# Patient Record
Sex: Male | Born: 1945 | Race: White | Hispanic: No | Marital: Single | Smoking: Former smoker
Health system: Southern US, Community
[De-identification: ages and names within clinical notes are randomized; demographics above are authoritative.]

## PROBLEM LIST (undated history)

## (undated) DIAGNOSIS — E119 Type 2 diabetes mellitus without complications: Secondary | ICD-10-CM

## (undated) DIAGNOSIS — IMO0001 Reserved for inherently not codable concepts without codable children: Secondary | ICD-10-CM

## (undated) DIAGNOSIS — I10 Essential (primary) hypertension: Secondary | ICD-10-CM

## (undated) DIAGNOSIS — I5040 Unspecified combined systolic (congestive) and diastolic (congestive) heart failure: Secondary | ICD-10-CM

## (undated) DIAGNOSIS — I272 Pulmonary hypertension, unspecified: Secondary | ICD-10-CM

## (undated) DIAGNOSIS — J9610 Chronic respiratory failure, unspecified whether with hypoxia or hypercapnia: Secondary | ICD-10-CM

## (undated) DIAGNOSIS — I2510 Atherosclerotic heart disease of native coronary artery without angina pectoris: Secondary | ICD-10-CM

## (undated) DIAGNOSIS — J449 Chronic obstructive pulmonary disease, unspecified: Secondary | ICD-10-CM

## (undated) DIAGNOSIS — Z794 Long term (current) use of insulin: Secondary | ICD-10-CM

---

## 2015-10-17 ENCOUNTER — Inpatient Hospital Stay
Admission: RE | Admit: 2015-10-17 | Discharge: 2015-11-25 | Disposition: A | Discharge status: Skilled Nursing Facility | Payer: Medicare Other | Attending: Internal Medicine | Admitting: Internal Medicine

## 2015-10-17 DIAGNOSIS — J9621 Acute and chronic respiratory failure with hypoxia: Secondary | ICD-10-CM

## 2015-10-17 DIAGNOSIS — J810 Acute pulmonary edema: Secondary | ICD-10-CM

## 2015-10-17 DIAGNOSIS — Z431 Encounter for attention to gastrostomy: Secondary | ICD-10-CM

## 2015-10-17 DIAGNOSIS — R059 Cough, unspecified: Secondary | ICD-10-CM

## 2015-10-17 DIAGNOSIS — R0603 Acute respiratory distress: Secondary | ICD-10-CM

## 2015-10-17 DIAGNOSIS — J9690 Respiratory failure, unspecified, unspecified whether with hypoxia or hypercapnia: Secondary | ICD-10-CM

## 2015-10-17 DIAGNOSIS — I272 Pulmonary hypertension, unspecified: Secondary | ICD-10-CM

## 2015-10-17 DIAGNOSIS — I4901 Ventricular fibrillation: Secondary | ICD-10-CM

## 2015-10-17 DIAGNOSIS — J430 Unilateral pulmonary emphysema [MacLeod's syndrome]: Secondary | ICD-10-CM

## 2015-10-17 DIAGNOSIS — Z931 Gastrostomy status: Secondary | ICD-10-CM

## 2015-10-17 DIAGNOSIS — Z430 Encounter for attention to tracheostomy: Secondary | ICD-10-CM

## 2015-10-17 DIAGNOSIS — R05 Cough: Secondary | ICD-10-CM

## 2015-10-17 DIAGNOSIS — I252 Old myocardial infarction: Secondary | ICD-10-CM

## 2015-10-17 DIAGNOSIS — Z452 Encounter for adjustment and management of vascular access device: Secondary | ICD-10-CM

## 2015-10-17 DIAGNOSIS — I340 Nonrheumatic mitral (valve) insufficiency: Secondary | ICD-10-CM

## 2015-10-17 DIAGNOSIS — I2510 Atherosclerotic heart disease of native coronary artery without angina pectoris: Secondary | ICD-10-CM

## 2015-10-17 DIAGNOSIS — J9622 Acute and chronic respiratory failure with hypercapnia: Secondary | ICD-10-CM

## 2015-10-17 DIAGNOSIS — J9611 Chronic respiratory failure with hypoxia: Secondary | ICD-10-CM

## 2015-10-17 DIAGNOSIS — Z4659 Encounter for fitting and adjustment of other gastrointestinal appliance and device: Secondary | ICD-10-CM

## 2015-10-17 HISTORY — DX: Atherosclerotic heart disease of native coronary artery without angina pectoris: I25.10

## 2015-10-17 HISTORY — DX: Pulmonary hypertension, unspecified: I27.20

## 2015-10-17 HISTORY — DX: Long term (current) use of insulin: Z79.4

## 2015-10-17 HISTORY — DX: Type 2 diabetes mellitus without complications: E11.9

## 2015-10-17 HISTORY — DX: Chronic obstructive pulmonary disease, unspecified: J44.9

## 2015-10-17 HISTORY — DX: Essential (primary) hypertension: I10

## 2015-10-17 HISTORY — DX: Unspecified combined systolic (congestive) and diastolic (congestive) heart failure: I50.40

## 2015-10-17 HISTORY — DX: Chronic respiratory failure, unspecified whether with hypoxia or hypercapnia: J96.10

## 2015-10-17 HISTORY — DX: Reserved for inherently not codable concepts without codable children: IMO0001

## 2015-10-17 LAB — PROTIME-INR
INR: 1.14
PROTHROMBIN TIME: 14.6 s (ref 11.4–15.2)

## 2015-10-19 ENCOUNTER — Other Ambulatory Visit (HOSPITAL_COMMUNITY): Payer: Medicare Other

## 2015-10-19 ENCOUNTER — Other Ambulatory Visit: Payer: Self-pay

## 2015-10-19 LAB — COMPREHENSIVE METABOLIC PANEL
ALT: 33 U/L (ref 17–63)
AST: 20 U/L (ref 15–41)
Albumin: 2.9 g/dL — ABNORMAL LOW (ref 3.5–5.0)
Alkaline Phosphatase: 93 U/L (ref 38–126)
Anion gap: 9 (ref 5–15)
BILIRUBIN TOTAL: 0.7 mg/dL (ref 0.3–1.2)
BUN: 26 mg/dL — ABNORMAL HIGH (ref 6–20)
CHLORIDE: 93 mmol/L — AB (ref 101–111)
CO2: 34 mmol/L — ABNORMAL HIGH (ref 22–32)
CREATININE: 1.02 mg/dL (ref 0.61–1.24)
Calcium: 9 mg/dL (ref 8.9–10.3)
GFR, EST NON AFRICAN AMERICAN: 54 mL/min — AB (ref >60)
Glucose, Bld: 301 mg/dL — ABNORMAL HIGH (ref 65–99)
Potassium: 4.8 mmol/L (ref 3.5–5.1)
Sodium: 136 mmol/L (ref 135–145)
TOTAL PROTEIN: 5.6 g/dL — AB (ref 6.5–8.1)

## 2015-10-19 LAB — TROPONIN I
TROPONIN I: 0.04 ng/mL — AB (ref <0.03)
TROPONIN I: 0.04 ng/mL — AB (ref <0.03)
Troponin I: 0.05 ng/mL (ref <0.03)

## 2015-10-19 LAB — BLOOD GAS, ARTERIAL
ACID-BASE EXCESS: 10.5 mmol/L — AB (ref 0.0–2.0)
Acid-Base Excess: 10.9 mmol/L — ABNORMAL HIGH (ref 0.0–2.0)
BICARBONATE: 37.5 mmol/L — AB (ref 20.0–28.0)
Bicarbonate: 36.8 mmol/L — ABNORMAL HIGH (ref 20.0–28.0)
Delivery systems: POSITIVE
Expiratory PAP: 6
Expiratory PAP: 7
FIO2: 1
FIO2: 50
INSPIRATORY PAP: 16
Inspiratory PAP: 12
MODE: POSITIVE
O2 SAT: 99.7 %
O2 SAT: 99.1 %
PCO2 ART: 69.8 mmHg — AB (ref 32.0–48.0)
PH ART: 7.268 — AB (ref 7.350–7.450)
PO2 ART: 233 mmHg — AB (ref 83.0–108.0)
Patient temperature: 98.6
Patient temperature: 98.6
pCO2 arterial: 84.8 mmHg (ref 32.0–48.0)
pH, Arterial: 7.342 — ABNORMAL LOW (ref 7.350–7.450)
pO2, Arterial: 132 mmHg — ABNORMAL HIGH (ref 83.0–108.0)

## 2015-10-19 LAB — CBC WITH DIFFERENTIAL/PLATELET
BASOS ABS: 0 10*3/uL (ref 0.0–0.1)
BASOS PCT: 0 %
EOS ABS: 0.1 10*3/uL (ref 0.0–0.7)
Eosinophils Relative: 1 %
HCT: 35 % — ABNORMAL LOW (ref 39.0–52.0)
HEMOGLOBIN: 10.9 g/dL — AB (ref 13.0–17.0)
LYMPHS ABS: 0.7 10*3/uL (ref 0.7–4.0)
Lymphocytes Relative: 6 %
MCH: 29 pg (ref 26.0–34.0)
MCHC: 31.1 g/dL (ref 30.0–36.0)
MCV: 93.1 fL (ref 78.0–100.0)
Monocytes Absolute: 0.6 10*3/uL (ref 0.1–1.0)
Monocytes Relative: 5 %
NEUTROS PCT: 88 %
Neutro Abs: 11 10*3/uL — ABNORMAL HIGH (ref 1.7–7.7)
PLATELETS: 301 10*3/uL (ref 150–400)
RBC: 3.76 MIL/uL — AB (ref 4.22–5.81)
RDW: 15.5 % (ref 11.5–15.5)
WBC: 12.4 10*3/uL — AB (ref 4.0–10.5)

## 2015-10-19 LAB — CBC
HCT: 33.5 % — ABNORMAL LOW (ref 39.0–52.0)
HEMOGLOBIN: 10.2 g/dL — AB (ref 13.0–17.0)
MCH: 28.5 pg (ref 26.0–34.0)
MCHC: 30.4 g/dL (ref 30.0–36.0)
MCV: 93.6 fL (ref 78.0–100.0)
PLATELETS: 269 10*3/uL (ref 150–400)
RBC: 3.58 MIL/uL — ABNORMAL LOW (ref 4.22–5.81)
RDW: 15.3 % (ref 11.5–15.5)
WBC: 9.5 10*3/uL (ref 4.0–10.5)

## 2015-10-19 LAB — BASIC METABOLIC PANEL
Anion gap: 6 (ref 5–15)
BUN: 27 mg/dL — AB (ref 6–20)
CHLORIDE: 92 mmol/L — AB (ref 101–111)
CO2: 40 mmol/L — ABNORMAL HIGH (ref 22–32)
CREATININE: 1.32 mg/dL — AB (ref 0.61–1.24)
Calcium: 8.8 mg/dL — ABNORMAL LOW (ref 8.9–10.3)
GFR calc Af Amer: >60 mL/min (ref >60)
GFR calc non Af Amer: 52 mL/min — ABNORMAL LOW (ref >60)
Glucose, Bld: 323 mg/dL — ABNORMAL HIGH (ref 65–99)
Potassium: 4.3 mmol/L (ref 3.5–5.1)
SODIUM: 138 mmol/L (ref 135–145)

## 2015-10-19 LAB — PHOSPHORUS: Phosphorus: 4.9 mg/dL — ABNORMAL HIGH (ref 2.5–4.6)

## 2015-10-19 LAB — MAGNESIUM: MAGNESIUM: 1.8 mg/dL (ref 1.7–2.4)

## 2015-10-19 LAB — PROTIME-INR
INR: 1.14
Prothrombin Time: 14.6 seconds (ref 11.4–15.2)

## 2015-10-20 ENCOUNTER — Encounter: Payer: Self-pay | Admitting: Adult Health

## 2015-10-20 DIAGNOSIS — R0603 Acute respiratory distress: Secondary | ICD-10-CM

## 2015-10-20 DIAGNOSIS — R0600 Dyspnea, unspecified: Secondary | ICD-10-CM

## 2015-10-20 LAB — TSH: TSH: 0.961 u[IU]/mL (ref 0.350–4.500)

## 2015-10-20 NOTE — Consult Note (Signed)
Name: Preston Benjamin MRN: [id number] DOB: [birth year]    ADMISSION DATE:  10/17/2015 CONSULTATION DATE:  9/18  REFERRING MD :  Hijazi ([facility])   CHIEF COMPLAINT:  Respiratory failure   BRIEF PATIENT DESCRIPTION: 70yo male with hx combined heart failure, severe COPD, HTN, DM with multiple recent admissions to [facility] for acute on chronic respiratory failure r/t AECOPD and decompensated heart failure.  Many of these admissions have required intubation.  On previous d/c 9/1 he was sent home with trilogy bipap.  He unfortunately continued to have increased somnolence and SOB and was admitted again 9/5, again requiring intubation.  During that admission he was found to have worsening pulmonary HTN with PASP as well as significant bilateral pleural effusions s/p thoracentesis (transudate).  He was diuresed and treated for AECOPD and again extubated but continued to required high flow O2 and was d/c to [facility] 9/15.  PCCM consulted to assist as he continues to require vapotherm 30LPM.    SIGNIFICANT EVENTS    STUDIES:     HISTORY OF PRESENT ILLNESS:  70yo male with hx combined heart failure, severe COPD, HTN, DM with multiple recent admissions to [facility] for acute on chronic respiratory failure r/t AECOPD and decompensated heart failure.  Many of these admissions have required intubation.  On previous d/c 9/1 he was sent home with trilogy bipap.  He unfortunately continued to have increased somnolence and SOB and was admitted again 9/5, again requiring intubation.  During that admission he was found to have worsening pulmonary HTN with PASP as well as significant bilateral pleural effusions s/p thoracentesis (transudate).  He was diuresed and treated for AECOPD and again extubated but continued to required high flow O2 and was d/c to [facility] 9/15.  PCCM consulted to assist as he continues to require vapotherm 30LPM.  Currently denies SOB, chest pain, edema.  Wants to get up.     PAST  MEDICAL HISTORY :   has a past medical history of CAD (coronary artery disease); Chronic respiratory failure (HCC); Combined congestive systolic and diastolic heart failure (HCC); COPD (chronic obstructive pulmonary disease) (HCC); Hypertension; IDDM (insulin dependent diabetes mellitus) (HCC); and Pulmonary hypertension (HCC).  has no past surgical history on file. Prior to Admission medications   Not on File   Allergies no known allergies  FAMILY HISTORY:  CAD, COPD   SOCIAL HISTORY:  reports that he has quit smoking. He has never used smokeless tobacco.  REVIEW OF SYSTEMS:   As per HPI - All other systems reviewed and were neg.    SUBJECTIVE:   VITAL SIGNS: HR 82 RR 18 sats 96% on 30LPM vapotherm  PHYSICAL EXAMINATION: General:  Chronically ill appearing male, NAD in bed  Neuro:  Awake, alert, intermittent confusion but overall appropriate, MAE, gen weakness  HEENT:  Mm moist, no JVD  Cardiovascular:  s1s2 rrr Lungs:  resps even non labored on high flow nasal cannula, diminished bases, few scattered wheezes  Abdomen:  Round, soft, +bs  Musculoskeletal:  Warm and dry, scant BLE edema    Recent Labs Lab 10/17/15 1647 10/19/15 1007  NA 136 138  K 4.8 4.3  CL 93* 92*  CO2 34* 40*  BUN 26* 27*  CREATININE 1.02 1.32*  GLUCOSE 301* 323*    Recent Labs Lab 10/17/15 1647 10/19/15 1007  HGB 10.9* 10.2*  HCT 35.0* 33.5*  WBC 12.4* 9.5  PLT 301 269   Dg Chest Port 1 View  Result Date: 10/19/2015 CLINICAL  DATA:  Respiratory failure. Pt unable to relate history at this time. EXAM: PORTABLE CHEST 1 VIEW COMPARISON:  None. FINDINGS: Normal cardiac silhouette. Band of atelectasis in the LEFT lower lobe. Diffuse airspace disease in the RIGHT upper lobe and RIGHT lower lobe. No pneumothorax. IMPRESSION: 1. Diffuse airspace disease in the RIGHT upper lobe and RIGHT lower lobe suggests asymmetric edema versus multifocal pneumonia. 2. LEFT lower lobe atelectasis. Electronically  Signed   By: Genevive BiStewart  Edmunds M.D.   On: 10/19/2015 09:52    ASSESSMENT / PLAN:  Acute on chronic hypercarbic and hypoxic respiratory failure - multifactorial in setting decompensated combined heart failure, pulmonary HTN, severe COPD likely c/b oversedation at home.  Requiring high flow O2.  Has had multiple recent admissions requiring intubation.  Readmitted despite d/c on trilogy.   PLAN -  Aggressive diuresis as SCr and BP allow  BD's  Mobilize  Wean FiO2 as able - has weaned from 40LPM/40% to 30LMP/35%  Avoid sedating medications F/u echo   F/u CXR  If requires intubation again would need trach     Dirk DressKaty Whiteheart, NP 10/20/2015  10:51 AM Pager: [phone] or [phone]   STAFF NOTE: I, Rory Percyaniel Feinstein, MD FACP have personally reviewed patient's available data, including medical history, events of note, physical examination and test results as part of my evaluation. I have discussed with resident/NP and other care providers such as pharmacist, RN and RRT. In addition, I personally evaluated patient and elicited key findings of: no distress in chair eating, coarse distant BS, throughout day with improved O2 needs now about to get to traditional nasal cannula O2, his pulm htn also playing a part , needs contuinued diuresis, need re chek crt  Prior to lasix increase however , no new chem noted, he has fluid in fissure on pcxr, if he is stagnent after above would echo r/o shunt with bubbles and Ct chest  Mcarthur Rossettianiel J. Tyson AliasFeinstein, MD, FACP Pgr: [phone] [facility] 10/20/2015 12:34 PM

## 2015-10-21 LAB — TROPONIN I: TROPONIN I: 0.04 ng/mL — AB (ref <0.03)

## 2015-10-22 ENCOUNTER — Other Ambulatory Visit (HOSPITAL_COMMUNITY): Payer: Medicare Other

## 2015-10-22 DIAGNOSIS — I2510 Atherosclerotic heart disease of native coronary artery without angina pectoris: Secondary | ICD-10-CM

## 2015-10-22 DIAGNOSIS — R0600 Dyspnea, unspecified: Secondary | ICD-10-CM | POA: Diagnosis not present

## 2015-10-22 DIAGNOSIS — I252 Old myocardial infarction: Secondary | ICD-10-CM | POA: Diagnosis not present

## 2015-10-22 DIAGNOSIS — I4901 Ventricular fibrillation: Secondary | ICD-10-CM | POA: Diagnosis not present

## 2015-10-22 DIAGNOSIS — I272 Other secondary pulmonary hypertension: Secondary | ICD-10-CM

## 2015-10-22 DIAGNOSIS — I340 Nonrheumatic mitral (valve) insufficiency: Secondary | ICD-10-CM

## 2015-10-22 LAB — HEPATIC FUNCTION PANEL
ALBUMIN: 3 g/dL — AB (ref 3.5–5.0)
ALT: 55 U/L (ref 17–63)
AST: 35 U/L (ref 15–41)
Alkaline Phosphatase: 86 U/L (ref 38–126)
BILIRUBIN TOTAL: 0.9 mg/dL (ref 0.3–1.2)
Bilirubin, Direct: 0.2 mg/dL (ref 0.1–0.5)
Indirect Bilirubin: 0.7 mg/dL (ref 0.3–0.9)
Total Protein: 5.3 g/dL — ABNORMAL LOW (ref 6.5–8.1)

## 2015-10-22 LAB — CBC
HCT: 32.7 % — ABNORMAL LOW (ref 39.0–52.0)
Hemoglobin: 10.2 g/dL — ABNORMAL LOW (ref 13.0–17.0)
MCH: 28.9 pg (ref 26.0–34.0)
MCHC: 31.2 g/dL (ref 30.0–36.0)
MCV: 92.6 fL (ref 78.0–100.0)
Platelets: 296 10*3/uL (ref 150–400)
RBC: 3.53 MIL/uL — ABNORMAL LOW (ref 4.22–5.81)
RDW: 15.1 % (ref 11.5–15.5)
WBC: 13.3 10*3/uL — ABNORMAL HIGH (ref 4.0–10.5)

## 2015-10-22 LAB — BLOOD GAS, ARTERIAL
ACID-BASE EXCESS: 10 mmol/L — AB (ref 0.0–2.0)
Acid-Base Excess: 7.4 mmol/L — ABNORMAL HIGH (ref 0.0–2.0)
Acid-Base Excess: 12.1 mmol/L — ABNORMAL HIGH (ref 0.0–2.0)
BICARBONATE: 35.6 mmol/L — AB (ref 20.0–28.0)
Bicarbonate: 33.1 mmol/L — ABNORMAL HIGH (ref 20.0–28.0)
Bicarbonate: 36.9 mmol/L — ABNORMAL HIGH (ref 20.0–28.0)
Delivery systems: POSITIVE
Expiratory PAP: 8
FIO2: 1
FIO2: 35
FIO2: 80
Inspiratory PAP: 16
MECHVT: 500 mL
O2 Saturation: 100 %
O2 Saturation: 97.9 %
PATIENT TEMPERATURE: 98.6
PEEP: 5 cmH2O
PO2 ART: 297 mmHg — AB (ref 83.0–108.0)
Patient temperature: 98.6
Patient temperature: 98.6
RATE: 15 resp/min
pCO2 arterial: 64 mmHg — ABNORMAL HIGH (ref 32.0–48.0)
pCO2 arterial: 55.6 mmHg — ABNORMAL HIGH (ref 32.0–48.0)
pCO2 arterial: 64.4 mmHg — ABNORMAL HIGH (ref 32.0–48.0)
pH, Arterial: 7.334 — ABNORMAL LOW (ref 7.350–7.450)
pH, Arterial: 7.438 (ref 7.350–7.450)
pH, Arterial: 7.362 (ref 7.350–7.450)
pO2, Arterial: 391 mmHg — ABNORMAL HIGH (ref 83.0–108.0)
pO2, Arterial: 98 mmHg (ref 83.0–108.0)

## 2015-10-22 LAB — BASIC METABOLIC PANEL
Anion gap: 11 (ref 5–15)
Anion gap: 8 (ref 5–15)
BUN: 25 mg/dL — ABNORMAL HIGH (ref 6–20)
BUN: 23 mg/dL — AB (ref 6–20)
CALCIUM: 8.7 mg/dL — AB (ref 8.9–10.3)
CO2: 32 mmol/L (ref 22–32)
CO2: 36 mmol/L — ABNORMAL HIGH (ref 22–32)
CREATININE: 1.08 mg/dL (ref 0.61–1.24)
Calcium: 8.8 mg/dL — ABNORMAL LOW (ref 8.9–10.3)
Chloride: 91 mmol/L — ABNORMAL LOW (ref 101–111)
Chloride: 93 mmol/L — ABNORMAL LOW (ref 101–111)
Creatinine, Ser: 1.18 mg/dL (ref 0.61–1.24)
GFR calc Af Amer: >60 mL/min (ref >60)
GFR calc non Af Amer: 60 mL/min — ABNORMAL LOW (ref >60)
Glucose, Bld: 353 mg/dL — ABNORMAL HIGH (ref 65–99)
Glucose, Bld: 245 mg/dL — ABNORMAL HIGH (ref 65–99)
Potassium: 4.9 mmol/L (ref 3.5–5.1)
Potassium: 3.3 mmol/L — ABNORMAL LOW (ref 3.5–5.1)
SODIUM: 137 mmol/L (ref 135–145)
Sodium: 134 mmol/L — ABNORMAL LOW (ref 135–145)

## 2015-10-22 LAB — TROPONIN I: Troponin I: 0.04 ng/mL (ref <0.03)

## 2015-10-22 LAB — MAGNESIUM: MAGNESIUM: 1.8 mg/dL (ref 1.7–2.4)

## 2015-10-22 LAB — TSH: TSH: 2.063 u[IU]/mL (ref 0.350–4.500)

## 2015-10-22 LAB — BRAIN NATRIURETIC PEPTIDE: B Natriuretic Peptide: 411.5 pg/mL — ABNORMAL HIGH (ref 0.0–100.0)

## 2015-10-22 MED ORDER — AMIODARONE LOAD VIA INFUSION: 150.0000 mg | Freq: Once | INTRAVENOUS | Status: DC

## 2015-10-22 MED ORDER — AMIODARONE HCL IN DEXTROSE 360-4.14 MG/200ML-% IV SOLN: 30.0000 mg/h | INTRAVENOUS | Status: DC

## 2015-10-22 MED ORDER — AMIODARONE HCL IN DEXTROSE 360-4.14 MG/200ML-% IV SOLN: 60.0000 mg/h | INTRAVENOUS | Status: AC

## 2015-10-22 NOTE — Code Documentation (Signed)
CODE BLUE NOTE  Patient Name: Preston SandersBobby Benjamin   MRN: [id number]   Date of Birth/ Sex: 13-Dec-1945 , male      Admission Date: 10/17/2015  Attending Provider: Carron CurieAli Hijazi, MD  Primary Diagnosis: <principal problem not specified>    Indication: Pt was in his usual state of health until this AM, when he was noted to be in Vfib, minutes after interacting with RN and asking for his light to be turned off. Code blue was subsequently called. At the time of arrival on scene, ACLS protocol was underway. Prior to my arrival, RNs had established vfib and defibrillated.     Technical Description:  - CPR performance duration:  12 minutes  - Was defibrillation or cardioversion used? Yes   - Was external pacer placed? No  - Was patient intubated pre/post CPR? Intubated during CPR with glidescope    Medications Administered: Y = Yes; Blank = No Amiodarone    Atropine    Calcium    Epinephrine    Lidocaine    Magnesium    Norepinephrine    Phenylephrine    Sodium bicarbonate    Vasopressin      Post CPR evaluation:  - Final Status - Was patient successfully resuscitated ? Yes - What is current rhythm? afib - What is current hemodynamic status? Hx of decompensated HF, but running 1L bolus after resuscitation.    Miscellaneous Information:  - Labs sent, including: CBC, BMP, BNP, ABG  - Primary team notified?  Yes  - Family Notified? No  - Additional notes/ transfer status: [facility]         Garth BignessKathryn Timberlake, MD  10/22/2015, 1:15 AM

## 2015-10-22 NOTE — Consult Note (Signed)
CARDIOLOGY CONSULT NOTE   Patient ID: Preston SandersBobby Meloy MRN: [id number] DOB/AGE: 1945/02/15 70 y.o.  Admit date: 10/17/2015  Primary Physician   No primary care provider on file. Primary Cardiologist   Unknow prior  Reason for Consultation   V.fib Requesting Physician  Dr. Elesa MassedHijaz  HPI: Preston Benjamin is a 70 y.o. male with a history of chronic respiratory failure, combined systole and diastole failure, early hypertension, diabetes, CAD, and multiple admissions for chronic hypercarbic respiratory failure for COPD and [facility] who requested to seen by cardiology for episode of V. Fib.  Recently discharged 10/03/15 with triology BiPAP. He continued to have worsening shortness of breath and increased somnolence and again requiring admission  and intubation 10/07/15. He was in cardiogenic shock. Troponin was negative. BNP 444. TTE 10/08/15 showed EF of 45% with severe hypokinetic motion of the posterior wall and aneurysmal inferior base., RSVP 68. Tx with Levophed for 24 hours and then extubated. Bilateral pleural effusion s/p bilateral thoracentesis. Ligght criteria was transudate in nature. Net diuresis of negative 16L. Pt felt not a good candidate for advanced HF treatment per HF team. Hx of CAD with cath in 2004 showed CTO of RCA with L--> R collaterals. Tx with medication with Bumex 2mg  BID, spironolactone 12.5mg , coreg 3.125mg  BID, ASA 81 and Lipitor 20mg . No ischemic evaluation. His hgb was 7.3 on admission, baseline 13, discharge hgb of 9.4.  UA showing hematuria. Felt increased risk for bladder cancer. [facility].  [facility], pt was seen by PPCM for acute on chronic hypercarbic and hypoxic respiratory failure. Plan for aggressive diuresis. Plan to get echo to r/o shunt with bubble and chest CT. Pending echo. Last night at 0053 went into ventricular fibrillation and requiring CPR and shock and return to sinus 0100. No EKG ordered. Today patient is confused and  cardiology asked for further evaluation.   Labs notable of BNP of 411.5. K 4.9, NA 134, Troponin of 0.04. Hgb of 10.2. EKG 10/19/15 showed sinus rhythm with PACs.    Past Medical History:  Diagnosis Date  . CAD (coronary artery disease)   . Chronic respiratory failure (HCC)   . Combined congestive systolic and diastolic heart failure (HCC)   . COPD (chronic obstructive pulmonary disease) (HCC)   . Hypertension   . IDDM (insulin dependent diabetes mellitus) (HCC)   . Pulmonary hypertension (HCC)    Allergies no known allergies  I have reviewed the patient's current medications   Reviewed current medications   Social History   Social History  . Marital status: N/A    Spouse name: N/A  . Number of children: N/A  . Years of education: N/A   Occupational History  . Not on file.   Social History Main Topics  . Smoking status: Former Games developermoker  . Smokeless tobacco: Never Used  . Alcohol use Not on file  . Drug use: Unknown  . Sexual activity: Not on file   Other Topics Concern  . Not on file   Social History Narrative  . No narrative on file    No family status information on file.    ROS:  Full 14 point review of systems complete and found to be negative unless listed above.  Physical Exam: There were no vitals taken for this visit.  General: confused  male in no acute distress Head: Eyes PERRLA, No xanthomas. Normocephalic and atraumatic, oropharynx without edema or exudate.  Lungs: Resp regular and unlabored, CTA. Heart:  RRR no s3, s4, or murmurs..   Neck: No carotid bruits. No lymphadenopathy. No JVD. Abdomen: Bowel sounds present, abdomen soft and non-tender without masses or hernias noted. Msk:  No spine or cva tenderness. No weakness, no joint deformities or effusions. Extremities: No clubbing, cyanosis or edema. DP/PT/Radials 2+ and equal bilaterally. Neuro: Alert to his name only. No focal deficits noted. Psych:  Confused  Skin: scattered bruise  Labs:    Lab Results  Component Value Date   WBC 13.3 (H) 10/22/2015   HGB 10.2 (L) 10/22/2015   HCT 32.7 (L) 10/22/2015   MCV 92.6 10/22/2015   PLT 296 10/22/2015   No results for input(s): INR in the last 72 hours.  Recent Labs Lab 10/17/15 1647  10/22/15 0124  NA 136  < > 134*  K 4.8  < > 4.9  CL 93*  < > 91*  CO2 34*  < > 32  BUN 26*  < > 25*  CREATININE 1.02  < > 1.18  CALCIUM 9.0  < > 8.8*  PROT 5.6*  --   --   BILITOT 0.7  --   --   ALKPHOS 93  --   --   ALT 33  --   --   AST 20  --   --   GLUCOSE 301*  < > 353*  ALBUMIN 2.9*  --   --   < > = values in this interval not displayed. Magnesium  Date Value Ref Range Status  10/19/2015 1.8 1.7 - 2.4 mg/dL Final    Recent Labs  11/91/47 2021 10/21/15 0351 10/22/15 0124  TROPONINI 0.05* 0.04* 0.04*   No results for input(s): TROPIPOC in the last 72 hours. No results found for: PROBNP No results found for: CHOL, HDL, LDLCALC, TRIG No results found for: DDIMER No results found for: LIPASE, AMYLASE TSH  Date/Time Value Ref Range Status  10/20/2015 02:21 PM 0.961 0.350 - 4.500 uIU/mL Final   No results found for: VITAMINB12, FOLATE, FERRITIN, TIBC, IRON, RETICCTPCT ECG:  10/19/15 sinus rhythm with PACS  Vent. rate 94 BPM PR interval 144 ms QRS duration 104 ms QT/QTc 330/412 ms P-R-T axes 60 61 265  Radiology:  Dg Chest Port 1 View  Addendum Date: 10/22/2015   ADDENDUM REPORT: 10/22/2015 05:40 ADDENDUM: An endotracheal tube is present with tip measuring about 4.5 cm above the carina. Visualization is limited due to motion artifact. Appears in satisfactory location. Electronically Signed   By: Burman Nieves M.D.   On: 10/22/2015 05:40   Result Date: 10/22/2015 CLINICAL DATA:  Respiratory failure. EXAM: PORTABLE CHEST 1 VIEW COMPARISON:  10/19/2015 FINDINGS: Cardiac enlargement. Atelectasis in the lung bases. Can't exclude consolidation in the left lung base behind the heart. Probable small pleural effusions.  Infiltration seen previously in the right upper lung has resolved. No pneumothorax. Calcification of the aorta. Degenerative changes in the spine. IMPRESSION: Cardiac enlargement with atelectasis in the lung bases. Probable bilateral small pleural effusions. Possible consolidation in the left lung base behind the heart. Electronically Signed: By: Burman Nieves M.D. On: 10/22/2015 01:44    ASSESSMENT AND PLAN:     1. V.fib - Requiring CPR for about 7 minutes with Shock with ROSC. No EKG taken during event. Will get EKG today. MD to review further. Pending echo ? Needs with bubble. Attending MD, Dr. Sharyon Medicus, want to know option of antiarrhythmic.   2. Chronic combined CHF - TTE 10/08/15 showed EF of 45% with severe hypokinetic motion  of the posterior wall and aneurysmal inferior base., RSVP 68.  3. CAD  -  Cath in 2004 showed CTO of RCA with L--> R collaterals. Tx with medication with Bumex 2mg  BID, spironolactone 12.5mg , coreg 3.125mg  BID, ASA 81 and Lipitor 20mg . No ischemic evaluation.    SignedManson Passey, PA 10/22/2015, 3:43 PM Pager [phone]  Co-Sign MD  Patient seen and examined. Agree with assessment and plan. straboscopy Mikael Spray is a 70 year old, Caucasian male, who has a history of severe COPD, combined diastolic and systolic heart failure, and his head had issues with hypoxic respiratory failure and shock. He has CAD with known chronic total occlusion of his RCA.  The catheterization in 2004 with left to right collaterals.  He was recently hospitalized [facility] with worsening shortness of breath, increased somnolence, and required intubation.  A transthoracic echo reportedly showed an EF of 45%, severe hypokinesis of the posterior wall and aneurysmal inferior base.  There was significant pulmonary hypertension, with an estimated pressure at 68.  He has undergone recent bilateral thoracentesis. Following diuresis and treatment for acute exacerbation of  COPD [facility]  He is been seen by the pulmonary service. Today apparently, the patient developed an episode of ventricular fibrillation was treated with CPR and successfully fibrillated.  Laboratory is notable for a magnesium of 1.8, potassium 4.9.  CO2 32.  BNP is mildly increased at 411.  Troponin is minimally increased at 0.04.  An ECG has shown sinus rhythm with occasional PACs.  QTc interval was normal and not prolonged. Exam is notable that he is confused and chronically ill-appearing.  JVD, approximated 7-8 cm.  He had decreased breath sounds diffusely.  Rhythm was regular and tachycardic at approximately 100 beats per minute with occasional ectopy.  There was a least a 2/6 murmur suggestive of mitral regurgitation at the apex. Abdomen soft, without tender.  The patient was stooling on himself during the evaluation.  It negative, Homans sign.  There is no significant edema to his lower extremity.  A 2-D echo Doppler study has been ordered, but has not yet been done.  I understand there is discussion concerning changing his advanced directives  CODE STATUS to a possible no code situation.  At present, following his recent VF episode I would initiate IV amiodarone. The patient denies any anginal type symptomatology.  Amiodarone may not be optimal long term with his significant lung disease, but short-term may allow potential stability of his dysrhythmia.   Lennette Bihari, MD, [facility] 10/22/2015 5:20 PM

## 2015-10-22 NOTE — Progress Notes (Signed)
Paged by pharmacist to place Amiodarone gtt orders. Orders placed previous in EPIC which is inaccessible by [facility] staff, will write order instruction in patient's chart.   Ramond DialSigned, [place]ette Meskill PA Pager: [phone]

## 2015-10-23 ENCOUNTER — Other Ambulatory Visit (HOSPITAL_COMMUNITY): Payer: Medicare Other

## 2015-10-23 DIAGNOSIS — J9621 Acute and chronic respiratory failure with hypoxia: Secondary | ICD-10-CM

## 2015-10-23 DIAGNOSIS — J9622 Acute and chronic respiratory failure with hypercapnia: Secondary | ICD-10-CM | POA: Diagnosis not present

## 2015-10-23 LAB — BLOOD GAS, ARTERIAL
ACID-BASE EXCESS: 8.1 mmol/L — AB (ref 0.0–2.0)
ACID-BASE EXCESS: 11.3 mmol/L — AB (ref 0.0–2.0)
BICARBONATE: 35.4 mmol/L — AB (ref 20.0–28.0)
Bicarbonate: 36.5 mmol/L — ABNORMAL HIGH (ref 20.0–28.0)
DELIVERY SYSTEMS: POSITIVE
Expiratory PAP: 6
FIO2: 1
FIO2: 80
INSPIRATORY PAP: 14
LHR: 12 {breaths}/min
LHR: 20 {breaths}/min
MECHVT: 500 mL
O2 SAT: 94.1 %
O2 SAT: 99.3 %
PATIENT TEMPERATURE: 98.6
PATIENT TEMPERATURE: 98.6
PEEP/CPAP: 5 cmH2O
PO2 ART: 91.9 mmHg (ref 83.0–108.0)
PO2 ART: 120 mmHg — AB (ref 83.0–108.0)
pCO2 arterial: 104 mmHg (ref 32.0–48.0)
pCO2 arterial: 46.1 mmHg (ref 32.0–48.0)
pH, Arterial: 7.17 — CL (ref 7.350–7.450)
pH, Arterial: 7.497 — ABNORMAL HIGH (ref 7.350–7.450)

## 2015-10-23 LAB — BASIC METABOLIC PANEL
ANION GAP: 12 (ref 5–15)
BUN: 19 mg/dL (ref 6–20)
CHLORIDE: 88 mmol/L — AB (ref 101–111)
CO2: 35 mmol/L — ABNORMAL HIGH (ref 22–32)
Calcium: 8.9 mg/dL (ref 8.9–10.3)
Creatinine, Ser: 1.15 mg/dL (ref 0.61–1.24)
GFR calc Af Amer: >60 mL/min (ref >60)
GLUCOSE: 427 mg/dL — AB (ref 65–99)
POTASSIUM: 3.9 mmol/L (ref 3.5–5.1)
SODIUM: 135 mmol/L (ref 135–145)

## 2015-10-23 LAB — MAGNESIUM: MAGNESIUM: 1.9 mg/dL (ref 1.7–2.4)

## 2015-10-23 NOTE — Progress Notes (Signed)
Patient Name: Preston Benjamin Date of Encounter: 10/23/2015  Primary Cardiologist: Dr. Rachelle Hora Problem List     Active Problems:   Respiratory distress   Old MI (myocardial infarction)   CAD in native artery   VF (ventricular fibrillation) (HCC)   Pulmonary hypertension (HCC)   Mitral regurgitation     Subjective   Intubated, alert. Does not follow simple commands.   Inpatient Medications    Scheduled Meds: . amiodarone  150 mg Intravenous Once   Continuous Infusions: . amiodarone     PRN Meds:.   Vital Signs    There were no vitals filed for this visit. No intake or output data in the 24 hours ending 10/23/15 1345 There were no vitals filed for this visit.  Physical Exam   GEN: Male in no acute distress.  HEENT: Intubated, NG tube in place.  Neck: Supple, no JVD, carotid bruits, or masses. Cardiac: RRR, no murmurs, rubs, or gallops. No clubbing, cyanosis, edema.  Radials/DP/PT 2+ and equal bilaterally.  Respiratory:  Diffuse rhonchi, no wheezing.  GI: Soft, nontender, nondistended, BS + x 4. MS: no deformity or atrophy. Skin: warm and dry, no rash. Neuro:  Hard to assess due to intubation Psych: Cannot assess as patient is intubated.   Labs    CBC  Recent Labs  10/22/15 0124  WBC 13.3*  HGB 10.2*  HCT 32.7*  MCV 92.6  PLT 296   Basic Metabolic Panel  Recent Labs  10/22/15 1852 10/23/15 0524  NA 137 135  K 3.3* 3.9  CL 93* 88*  CO2 36* 35*  GLUCOSE 245* 427*  BUN 23* 19  CREATININE 1.08 1.15  CALCIUM 8.7* 8.9  MG 1.8 1.9   Liver Function Tests  Recent Labs  10/22/15 1852  AST 35  ALT 55  ALKPHOS 86  BILITOT 0.9  PROT 5.3*  ALBUMIN 3.0*   No results for input(s): LIPASE, AMYLASE in the last 72 hours. Cardiac Enzymes  Recent Labs  10/21/15 0351 10/22/15 0124  TROPONINI 0.04* 0.04*   Thyroid Function Tests  Recent Labs  10/22/15 1852  TSH 2.063    Telemetry    NSR, occasional PVC's - Personally  Reviewed  ECG    NSR, diffuse ST depression- Personally Reviewed  Radiology    Dg Chest Port 1 View  Addendum Date: 10/22/2015   ADDENDUM REPORT: 10/22/2015 05:40 ADDENDUM: An endotracheal tube is present with tip measuring about 4.5 cm above the carina. Visualization is limited due to motion artifact. Appears in satisfactory location. Electronically Signed   By: Burman Nieves M.D.   On: 10/22/2015 05:40   Result Date: 10/22/2015 CLINICAL DATA:  Respiratory failure. EXAM: PORTABLE CHEST 1 VIEW COMPARISON:  10/19/2015 FINDINGS: Cardiac enlargement. Atelectasis in the lung bases. Can't exclude consolidation in the left lung base behind the heart. Probable small pleural effusions. Infiltration seen previously in the right upper lung has resolved. No pneumothorax. Calcification of the aorta. Degenerative changes in the spine. IMPRESSION: Cardiac enlargement with atelectasis in the lung bases. Probable bilateral small pleural effusions. Possible consolidation in the left lung base behind the heart. Electronically Signed: By: Burman Nieves M.D. On: 10/22/2015 01:44     Cardiac Studies   Echo pending.   Patient Profile  70 year old, Caucasian male, who has a history of severe COPD, combined diastolic and systolic heart failure, and his head had issues with hypoxic respiratory failure and shock. He has CAD with known chronic total occlusion of his RCA.  The catheterization in 2004 with left to right collaterals.  [facility] with worsening shortness of breath, increased somnolence, and required intubation.   [facility], a transthoracic echo reportedly showed an EF of 45%, severe hypokinesis of the posterior wall and aneurysmal inferior base.  There was significant pulmonary hypertension, with an estimated pressure at 68.  He has undergone recent bilateral thoracentesis. Following diuresis and treatment for acute exacerbation of COPD he [facility]  [facility]  He is been seen by the pulmonary service. Vivia BirminghamYesterdayt, the patient developed an episode of ventricular fibrillation was treated with CPR and successfully fibrillated.  Laboratory is notable for a magnesium of 1.8, potassium 4.9.  CO2 32.  BNP is mildly increased at 411.  Troponin is minimally increased at 0.04.  An ECG has shown sinus rhythm with occasional PACs.  QTc interval was normal and not prolonged.   Assessment & Plan  1. V.fib - Requiring CPR for about 7 minutes with Shock with ROSC.  - Started on IV Amiodarone yesterday with 150mg  load. No further V.fib. MD to advise on transitioning to PO Amio, however with chronic lung disease this could be contraindicated.   2. Chronic combined CHF - TTE 10/08/15 showed EF of 45% with severe hypokinetic motion of the posterior wall and aneurysmal inferior base., RSVP 68. - Echo pending for today.   3. CAD  -  Cath in 2004 showed CTO of RCA with L--> R collaterals. Tx with medication with Bumex 2mg  BID, spironolactone 12.5mg , coreg 3.125mg  BID, ASA 81 and Lipitor 20mg . No ischemic evaluation.    Signed, Preston IshikawaErin E Smith, NP  10/23/2015, 1:45 PM   Pt seen and examined. Agree with above. Pt was re-intubated this morning secondary to respiratory distress. He is on amiodarone drip with stable cardiac rhythm, sinus in the 70's without ectopy. K 3.9; Mg 1.9; BNP 411. LFTs normal.TSH normal. Will continue amiodarone presently, but may have long term issues with severe COPD.  Preston Guadalajarahomas Alaster Asfaw, MD  10/23/2015  3:07 PM

## 2015-10-23 NOTE — Progress Notes (Signed)
Name: Jinny SandersBobby Oplinger MRN: [id number] DOB: [birth year]    ADMISSION DATE:  10/17/2015 CONSULTATION DATE:  9/18  REFERRING MD :  Hijazi ([facility])   CHIEF COMPLAINT:  Respiratory failure   BRIEF PATIENT DESCRIPTION: 70yo male with hx combined heart failure, severe COPD, HTN, DM with multiple [facility] for acute on chronic respiratory failure r/t AECOPD and decompensated heart failure.  Many of these admissions have required intubation.  On previous d/c 9/1 he was sent home with trilogy bipap.  He unfortunately continued to have increased somnolence and SOB and was admitted again 9/5, again requiring intubation.  During that admission he was found to have worsening pulmonary HTN with PASP 68mmHg as well as significant bilateral pleural effusions s/p thoracentesis (transudate).  He was diuresed and treated for AECOPD and again extubated but continued to required high flow O2 and was d[facility] to [facility].  PCCM consulted to assist as he continues to require vapotherm 30LPM.    SIGNIFICANT EVENTS  9/20 - V fib arrest brief w/ patient reintubated  STUDIES:    SUBJECTIVE: VFib arrest early am yesterday.  7 mins CPR.  Now intubated.  Heavily sedated.  On amiodarone gtt.   REVIEW OF SYSTEMS: Unobtainable as the patient is currently on mechanical ventilation with tracheostomy in place.  VITAL SIGNS: HR 68 RR 20 sats 100% on100% FiO2 114/52  PHYSICAL EXAMINATION: General:  Chronically ill appearing male, NAD on vent  Neuro:  Sedated RASS -3   HEENT:  Mm moist, ETT Cardiovascular:  s1s2 rrr Lungs:  resps even non labored on vent, diminished bases, few scattered wheezes  Abdomen:  Round, soft, +bs  Musculoskeletal:  Warm and dry, scant BLE edema    Recent Labs Lab 10/22/15 0124 10/22/15 1852 10/23/15 0524  NA 134* 137 135  K 4.9 3.3* 3.9  CL 91* 93* 88*  CO2 32 36* 35*  BUN 25* 23* 19  CREATININE 1.18 1.08 1.15  GLUCOSE 353* 245* 427*    Recent Labs Lab  10/17/15 1647 10/19/15 1007 10/22/15 0124  HGB 10.9* 10.2* 10.2*  HCT 35.0* 33.5* 32.7*  WBC 12.4* 9.5 13.3*  PLT 301 269 296   Dg Chest Port 1 View  Addendum Date: 10/22/2015   ADDENDUM REPORT: 10/22/2015 05:40 ADDENDUM: An endotracheal tube is present with tip measuring about 4.5 cm above the carina. Visualization is limited due to motion artifact. Appears in satisfactory location. Electronically Signed   By: Burman NievesWilliam  Stevens M.D.   On: 10/22/2015 05:40   Result Date: 10/22/2015 CLINICAL DATA:  Respiratory failure. EXAM: PORTABLE CHEST 1 VIEW COMPARISON:  10/19/2015 FINDINGS: Cardiac enlargement. Atelectasis in the lung bases. Can't exclude consolidation in the left lung base behind the heart. Probable small pleural effusions. Infiltration seen previously in the right upper lung has resolved. No pneumothorax. Calcification of the aorta. Degenerative changes in the spine. IMPRESSION: Cardiac enlargement with atelectasis in the lung bases. Probable bilateral small pleural effusions. Possible consolidation in the left lung base behind the heart. Electronically Signed: By: Burman NievesWilliam  Stevens M.D. On: 10/22/2015 01:44    ASSESSMENT / PLAN:  Acute on chronic hypercarbic and hypoxic respiratory failure - multifactorial in setting decompensated combined heart failure, pulmonary HTN, severe COPD likely c/b oversedation at home.  Has had multiple recent admissions requiring intubation.  Readmitted despite d/c on trilogy. Now intubated post VFib arrest.  Requiring high flow O2.    PLAN -  Vent support - 8cc/kg  F/u CXR  F/u ABG Wean  FIO2 as able  Needs trach at this point  Doubt amiodarone is good long term choice for him  Aggressive diuresis as SCr and BP allow  Cards following  BD's  Wean sedation as able - over sedated this am  Echo pending  Consider CT chest   Dirk Dress, NP 10/23/2015  9:13 AM Pager: [phone] or [phone]  PCCM Attending Note: Patient with acute on  chronic hypoxic and hypercarbic respiratory failure after ventricular fibrillation arrest yesterday. Patient was subsequently reintubated. He nods yes to some difficulty breathing. Denies any pain at present.  Vital signs: Reviewed and as above. General:  Awake. No acute distress. Currently restrained.  Integument:  Warm & dry. No rash on exposed skin.  HEENT:  Moist mucus membranes. No scleral icterus. Endotracheal tube in place. Cardiovascular:  Regular rhythm No edema. No appreciable JVD.  Pulmonary:   Predominantly clear breath sounds bilaterally. Symmetric chest wall rise on ventilator. Abdomen: Soft. Normal bowel sounds. Nondistended.  Musculoskeletal:  Normal bulk and tone. No joint effusion appreciated. Neurological: Moving all 4 extremities equally. Wiggling toes on command. Nods to questions. Grossly nonfocal.  A/P: 70 year old male with known history of congestive heart failure and COPD. Patient suffered acute respiratory failure with hypoxia and hypercarbia on 9/23 he did have a very brief ventricular fibrillation arrest but does not appear to have any obvious neurological deficit at this time. Given patient's reintubation he should undergo tracheostomy placement for his own safety. Case was discussed with Dr. Sharyon Medicus.  1. Acute on chronic hypoxic & hypercarbic respiratory failure: Multifactorial. Recommend continuing wean of FiO2 to maintain saturation 90-94 percent. Patient will require tracheostomy placement for his own safety. 2. Status post ventricular fibrillation arrest: Echocardiogram pending. Currently on amiodarone infusion. Recommend consideration of alternative agent given his underlying pulmonary function to maintain his rhythm. Cardiology following.  Remainder of care per primary service.  Donna Christen Jamison Neighbor, M.D. [facility] Pulmonary & Critical Care Pager:  [phone] After 3pm or if no response, call [phone] 4:46 PM 10/23/15

## 2015-10-24 LAB — BASIC METABOLIC PANEL
Anion gap: 9 (ref 5–15)
BUN: 25 mg/dL — AB (ref 6–20)
CHLORIDE: 85 mmol/L — AB (ref 101–111)
CO2: 35 mmol/L — ABNORMAL HIGH (ref 22–32)
Calcium: 8.2 mg/dL — ABNORMAL LOW (ref 8.9–10.3)
Creatinine, Ser: 1.22 mg/dL (ref 0.61–1.24)
GFR calc Af Amer: >60 mL/min (ref >60)
GFR calc non Af Amer: 57 mL/min — ABNORMAL LOW (ref >60)
Glucose, Bld: 342 mg/dL — ABNORMAL HIGH (ref 65–99)
POTASSIUM: 3.2 mmol/L — AB (ref 3.5–5.1)
SODIUM: 129 mmol/L — AB (ref 135–145)

## 2015-10-24 LAB — CBC
HEMATOCRIT: 26.3 % — AB (ref 39.0–52.0)
Hemoglobin: 8.3 g/dL — ABNORMAL LOW (ref 13.0–17.0)
MCH: 28.9 pg (ref 26.0–34.0)
MCHC: 31.6 g/dL (ref 30.0–36.0)
MCV: 91.6 fL (ref 78.0–100.0)
Platelets: 189 10*3/uL (ref 150–400)
RBC: 2.87 MIL/uL — ABNORMAL LOW (ref 4.22–5.81)
RDW: 15.5 % (ref 11.5–15.5)
WBC: 9.9 10*3/uL (ref 4.0–10.5)

## 2015-10-24 NOTE — Progress Notes (Signed)
Patient Name: Preston Benjamin Date of Encounter: 10/24/2015  Primary Cardiologist: Dr. Rachelle HoraKelly  Hospital Problem List     Active Problems:   Respiratory distress   Old MI (myocardial infarction)   CAD in native artery   VF (ventricular fibrillation) (HCC)   Pulmonary hypertension (HCC)   Mitral regurgitation   Acute on chronic respiratory failure with hypoxia and hypercapnia (HCC)     Subjective   Intubated, alert.   Inpatient Medications    Scheduled Meds: . amiodarone  150 mg Intravenous Once   Continuous Infusions: . amiodarone     PRN Meds:.   Vital Signs    There were no vitals filed for this visit. No intake or output data in the 24 hours ending 10/24/15 1055 There were no vitals filed for this visit.  Physical Exam   GEN: Male in no acute distress.  HEENT: Intubated, NG tube in place.  Neck: Supple, no JVD, carotid bruits, or masses. Cardiac: RRR, no murmurs, rubs, or gallops. No clubbing, cyanosis, edema.  Radials/DP/PT 2+ and equal bilaterally.  Respiratory:  Diffuse rhonchi, no wheezing.  GI: Soft, nontender, nondistended, BS + x 4. MS: no deformity or atrophy. Skin: warm and dry, no rash. Neuro:  Hard to assess due to intubation Psych: Cannot assess as patient is intubated.    Labs    CBC  Recent Labs  10/22/15 0124  WBC 13.3*  HGB 10.2*  HCT 32.7*  MCV 92.6  PLT 296   Basic Metabolic Panel  Recent Labs  10/22/15 1852 10/23/15 0524  NA 137 135  K 3.3* 3.9  CL 93* 88*  CO2 36* 35*  GLUCOSE 245* 427*  BUN 23* 19  CREATININE 1.08 1.15  CALCIUM 8.7* 8.9  MG 1.8 1.9   Liver Function Tests  Recent Labs  10/22/15 1852  AST 35  ALT 55  ALKPHOS 86  BILITOT 0.9  PROT 5.3*  ALBUMIN 3.0*   Cardiac Enzymes  Recent Labs  10/22/15 0124  TROPONINI 0.04*   Thyroid Function Tests  Recent Labs  10/22/15 1852  TSH 2.063    Telemetry    NSR, occasional PVC's - Personally Reviewed  ECG    NSR, diffuse ST depression -  Personally Reviewed  Radiology    Dg Chest Port 1 View  Result Date: 10/23/2015 CLINICAL DATA:  Status post PICC and NG tube placement today. EXAM: PORTABLE CHEST 1 VIEW COMPARISON:  Single-view of the chest earlier today. FINDINGS: Right PICC is in place with the tip projecting in the lower superior vena cava. Endotracheal tube and NG tube are noted. The patient has extensive bilateral airspace disease which is worse on the right. There is likely a right pleural effusion. Cardiomegaly is noted. IMPRESSION: Tip of right PICC projects in the lower superior vena cava. Marked worsening in aeration since the study yesterday with extensive bilateral airspace disease and likely effusions, greater on the right, with an appearance most compatible with pulmonary edema. Electronically Signed   By: Drusilla Kannerhomas  Dalessio M.D.   On: 10/23/2015 15:47   Dg Abd Portable 1v  Result Date: 10/23/2015 CLINICAL DATA:  PICC line and NG tube placement EXAM: PORTABLE ABDOMEN - 1 VIEW COMPARISON:  None. FINDINGS: There is normal small bowel gas pattern. There is hazy airspace disease in right lung. Asymmetric edema or infiltrate cannot be excluded. NG tube is noted coiled within stomach with tip in proximal stomach. Partially visualized right PICC line with tip in distal SVC. IMPRESSION: There is hazy  airspace disease in right lung. Asymmetric edema or infiltrate cannot be excluded. NG tube is noted coiled within stomach with tip in proximal stomach. Partially visualized right PICC line with tip in distal SVC. Electronically Signed   By: Natasha Mead M.D.   On: 10/23/2015 15:44     Cardiac Studies  Echo pending - Nurse called Echo to have done today, according to nursing there was a delay after his rapid response/Code event and it was not done.    Patient Profile  70 year old, Caucasian male, who has a history of severe COPD, combined diastolic and systolic heart failure, and his head had issues with hypoxic respiratory failure  and shock. He has CAD with known chronic total occlusion of his RCA. The catheterization in 2004 with left to right collaterals. He was recently hospitalized [facility] with worsening shortness of breath, increased somnolence, and required intubation.   While at [facility], a transthoracic echo reportedly showed an EF of 45%, severe hypokinesis of the posterior wall and aneurysmal inferior base. There was significant pulmonary hypertension, with an estimated pressure at 68. He has undergone recent bilateral thoracentesis. Following diuresis and treatment for acute exacerbation of COPD [facility] He is been seen by the pulmonary service. Preston Benjamin, the patient developed an episode of ventricular fibrillation was treated with CPR and successfully fibrillated. Laboratory is notable for a magnesium of 1.8, potassium 4.9. CO2 32. BNP is mildly increased at 411. Troponin is minimally increased at 0.04. An ECG has shown sinus rhythm with occasional PACs. QTc interval was normal and not prolonged.   Assessment & Plan  1. V.fib - Requiring CPR for about 7 minutes with Shock with ROSC.  - Started on IV Amiodarone yesterday with 150mg  load. No further V.fib.   Will transition to po Amio today. Will start with 400mg  BID x 7 days, then 200mg  BID.   2. Chronic combined CHF - TTE 10/08/15 showed EF of 45% with severe hypokinetic motion of the posterior wall and aneurysmal inferior base., RSVP 68. - Echo pending for today.   3. CAD  - Cath in 2004 showed CTO of RCA with L-->R collaterals. Tx with medication with Bumex 2mg  BID, spironolactone 12.5mg , coreg 3.125mg  BID, ASA 81 and Lipitor 20mg . No ischemic evaluation.     Signed, Preston Ishikawa, NP  10/24/2015, 10:55 AM   Patient seen and examined. Agree with assessment and plan. Remains intubated; more alert. No further arrhythmia. Will dc iv amiodarone and transition to 400 mg bid for 7 days,  then 200 mg bid dosing regimen. Echo pending. K 3.2 earlier; replete to 4 range. Mg 1.9.   Preston Bihari, MD, [facility] 10/24/2015 2:13 PM

## 2015-10-25 LAB — BASIC METABOLIC PANEL
ANION GAP: 6 (ref 5–15)
BUN: 26 mg/dL — ABNORMAL HIGH (ref 6–20)
CALCIUM: 8.7 mg/dL — AB (ref 8.9–10.3)
CO2: 37 mmol/L — AB (ref 22–32)
Chloride: 91 mmol/L — ABNORMAL LOW (ref 101–111)
Creatinine, Ser: 1.26 mg/dL — ABNORMAL HIGH (ref 0.61–1.24)
GFR calc Af Amer: >60 mL/min (ref >60)
GFR calc non Af Amer: 55 mL/min — ABNORMAL LOW (ref >60)
GLUCOSE: 159 mg/dL — AB (ref 65–99)
POTASSIUM: 3.3 mmol/L — AB (ref 3.5–5.1)
Sodium: 134 mmol/L — ABNORMAL LOW (ref 135–145)

## 2015-10-26 ENCOUNTER — Other Ambulatory Visit (HOSPITAL_COMMUNITY): Payer: Medicare Other

## 2015-10-26 DIAGNOSIS — I429 Cardiomyopathy, unspecified: Secondary | ICD-10-CM

## 2015-10-26 LAB — POTASSIUM: Potassium: 3.5 mmol/L (ref 3.5–5.1)

## 2015-10-26 NOTE — Progress Notes (Signed)
Patient Name: Preston Benjamin Date of Encounter: 10/26/2015  Hospital Problem List     Active Problems:   Respiratory distress   Old MI (myocardial infarction)   CAD in native artery   VF (ventricular fibrillation) (HCC)   Pulmonary hypertension (HCC)   Mitral regurgitation   Acute on chronic respiratory failure with hypoxia and hypercapnia (HCC)     Subjective   Intubated.  Not agitated.    Inpatient Medications    . amiodarone  150 mg Intravenous Once    Vital Signs    There were no vitals filed for this visit. No intake or output data in the 24 hours ending 10/26/15 1630 There were no vitals filed for this visit.  Physical Exam    GEN: Well nourished, well developed, in no acute distress.  Neck: Supple, no JVD, carotid bruits, or masses. Cardiac: RRR, no rubs, or gallops. No clubbing, cyanosis, no edema.  Radials/DP/PT 2+ and equal bilaterally.  Respiratory:  Respirations  regular and unlabored, clear to auscultation bilaterally. GI: Soft, nontender, nondistended, BS + x 4. Neuro:  Strength and sensation are intact.   Labs    CBC  Recent Labs  10/24/15 1058  WBC 9.9  HGB 8.3*  HCT 26.3*  MCV 91.6  PLT 189   Basic Metabolic Panel  Recent Labs  10/24/15 1058 10/25/15 0550 10/26/15 1005  NA 129* 134*  --   K 3.2* 3.3* 3.5  CL 85* 91*  --   CO2 35* 37*  --   GLUCOSE 342* 159*  --   BUN 25* 26*  --   CREATININE 1.22 1.26*  --   CALCIUM 8.2* 8.7*  --    Liver Function Tests No results for input(s): AST, ALT, ALKPHOS, BILITOT, PROT, ALBUMIN in the last 72 hours. No results for input(s): LIPASE, AMYLASE in the last 72 hours. Cardiac Enzymes No results for input(s): CKTOTAL, CKMB, CKMBINDEX, TROPONINI in the last 72 hours. BNP Invalid input(s): POCBNP D-Dimer No results for input(s): DDIMER in the last 72 hours. Hemoglobin A1C No results for input(s): HGBA1C in the last 72 hours. Fasting Lipid Panel No results for input(s): CHOL, HDL,  LDLCALC, TRIG, CHOLHDL, LDLDIRECT in the last 72 hours. Thyroid Function Tests No results for input(s): TSH, T4TOTAL, T3FREE, THYROIDAB in the last 72 hours.  Invalid input(s): FREET3  Telemetry    Sinus, sinus tach  ECG      Radiology    Dg Chest Port 1 View  Result Date: 10/23/2015 CLINICAL DATA:  Status post PICC and NG tube placement today. EXAM: PORTABLE CHEST 1 VIEW COMPARISON:  Single-view of the chest earlier today. FINDINGS: Right PICC is in place with the tip projecting in the lower superior vena cava. Endotracheal tube and NG tube are noted. The patient has extensive bilateral airspace disease which is worse on the right. There is likely a right pleural effusion. Cardiomegaly is noted. IMPRESSION: Tip of right PICC projects in the lower superior vena cava. Marked worsening in aeration since the study yesterday with extensive bilateral airspace disease and likely effusions, greater on the right, with an appearance most compatible with pulmonary edema. Electronically Signed   By: Drusilla Kanner M.D.   On: 10/23/2015 15:47   Dg Chest Port 1 View  Addendum Date: 10/22/2015   ADDENDUM REPORT: 10/22/2015 05:40 ADDENDUM: An endotracheal tube is present with tip measuring about 4.5 cm above the carina. Visualization is limited due to motion artifact. Appears in satisfactory location. Electronically Signed  By: Burman NievesWilliam  Stevens M.D.   On: 10/22/2015 05:40   Result Date: 10/22/2015 CLINICAL DATA:  Respiratory failure. EXAM: PORTABLE CHEST 1 VIEW COMPARISON:  10/19/2015 FINDINGS: Cardiac enlargement. Atelectasis in the lung bases. Can't exclude consolidation in the left lung base behind the heart. Probable small pleural effusions. Infiltration seen previously in the right upper lung has resolved. No pneumothorax. Calcification of the aorta. Degenerative changes in the spine. IMPRESSION: Cardiac enlargement with atelectasis in the lung bases. Probable bilateral small pleural effusions.  Possible consolidation in the left lung base behind the heart. Electronically Signed: By: Burman NievesWilliam  Stevens M.D. On: 10/22/2015 01:44   Dg Chest Port 1 View  Result Date: 10/19/2015 CLINICAL DATA:  Respiratory failure. Pt unable to relate history at this time. EXAM: PORTABLE CHEST 1 VIEW COMPARISON:  None. FINDINGS: Normal cardiac silhouette. Band of atelectasis in the LEFT lower lobe. Diffuse airspace disease in the RIGHT upper lobe and RIGHT lower lobe. No pneumothorax. IMPRESSION: 1. Diffuse airspace disease in the RIGHT upper lobe and RIGHT lower lobe suggests asymmetric edema versus multifocal pneumonia. 2. LEFT lower lobe atelectasis. Electronically Signed   By: Genevive BiStewart  Edmunds M.D.   On: 10/19/2015 09:52   Dg Abd Portable 1v  Result Date: 10/23/2015 CLINICAL DATA:  PICC line and NG tube placement EXAM: PORTABLE ABDOMEN - 1 VIEW COMPARISON:  None. FINDINGS: There is normal small bowel gas pattern. There is hazy airspace disease in right lung. Asymmetric edema or infiltrate cannot be excluded. NG tube is noted coiled within stomach with tip in proximal stomach. Partially visualized right PICC line with tip in distal SVC. IMPRESSION: There is hazy airspace disease in right lung. Asymmetric edema or infiltrate cannot be excluded. NG tube is noted coiled within stomach with tip in proximal stomach. Partially visualized right PICC line with tip in distal SVC. Electronically Signed   By: Natasha MeadLiviu  Pop M.D.   On: 10/23/2015 15:44    Assessment & Plan    VFIB ARREST:  No further arrhythmia.  On PO amiodarone.   No change in therapy.   CARDIOMYOPATHY:  Seems to be euvolemic.  Plan is for trach tomorrow.    Signed, Rollene RotundaJames Oswald Pott, MD  10/26/2015, 4:30 PM

## 2015-10-27 ENCOUNTER — Other Ambulatory Visit (HOSPITAL_COMMUNITY): Payer: Medicare Other

## 2015-10-27 ENCOUNTER — Ambulatory Visit (HOSPITAL_COMMUNITY): Payer: Medicare Other | Attending: Internal Medicine

## 2015-10-27 DIAGNOSIS — I509 Heart failure, unspecified: Secondary | ICD-10-CM | POA: Diagnosis not present

## 2015-10-27 DIAGNOSIS — I2510 Atherosclerotic heart disease of native coronary artery without angina pectoris: Secondary | ICD-10-CM | POA: Diagnosis not present

## 2015-10-27 DIAGNOSIS — J430 Unilateral pulmonary emphysema [MacLeod's syndrome]: Secondary | ICD-10-CM | POA: Diagnosis not present

## 2015-10-27 DIAGNOSIS — I5021 Acute systolic (congestive) heart failure: Secondary | ICD-10-CM | POA: Insufficient documentation

## 2015-10-27 DIAGNOSIS — J9621 Acute and chronic respiratory failure with hypoxia: Secondary | ICD-10-CM | POA: Diagnosis not present

## 2015-10-27 DIAGNOSIS — J810 Acute pulmonary edema: Secondary | ICD-10-CM

## 2015-10-27 NOTE — Progress Notes (Signed)
  Echocardiogram 2D Echocardiogram has been performed.  Preston Benjamin, Preston Benjamin 10/27/2015, 5:21 PM

## 2015-10-27 NOTE — Progress Notes (Signed)
   Name: Preston Benjamin MRN: [id number] DOB: 10-10-1945    ADMISSION DATE:  10/17/2015 CONSULTATION DATE:  9/18  REFERRING MD :  Hijazi ([facility])   CHIEF COMPLAINT:  Respiratory failure   BRIEF PATIENT DESCRIPTION: 70yo male with hx combined heart failure, severe COPD, HTN, DM with [facility] acute on chronic respiratory failure r/t AECOPD and decompensated heart failure.  Many of these admissions have required intubation.  On previous d/c 9/1 he was sent home with trilogy bipap.  He unfortunately continued to have increased somnolence and SOB and was admitted again 9/5, again requiring intubation.  During that admission he was found to have worsening pulmonary HTN with PASP 68mmHg as well as significant bilateral pleural effusions s/p thoracentesis (transudate).  He was diuresed and treated for AECOPD and again extubated but continued to required high flow O2 and was [facility].  PCCM consulted to assist as he continues to require vapotherm 30LPM.    SIGNIFICANT EVENTS  9/20 - V fib arrest brief w/ patient reintubated   SUBJECTIVE:  Comfortable. Not in distress.     VITAL SIGNS: VSS.  O2 sats 100% on 60% Fio2.   PHYSICAL EXAMINATION: General:  Chronically ill appearing male, NAD on vent  Neuro:  Sedated RASS -3   HEENT:  Mm moist, ETT Cardiovascular:  s1s2 rrr Lungs:  resps even non labored on vent, diminished bases. Some crackles at bases.  Abdomen:  Round, soft, +bs  Musculoskeletal:  Warm and dry, scant BLE edema    Recent Labs Lab 10/23/15 0524 10/24/15 1058 10/25/15 0550 10/26/15 1005  NA 135 129* 134*  --   K 3.9 3.2* 3.3* 3.5  CL 88* 85* 91*  --   CO2 35* 35* 37*  --   BUN 19 25* 26*  --   CREATININE 1.15 1.22 1.26*  --   GLUCOSE 427* 342* 159*  --     Recent Labs Lab 10/22/15 0124 10/24/15 1058  HGB 10.2* 8.3*  HCT 32.7* 26.3*  WBC 13.3* 9.9  PLT 296 189   Dg Chest Port 1 View  Result Date: 10/27/2015 CLINICAL  DATA:  Respiratory failure.  Pulmonary hypertension. EXAM: PORTABLE CHEST 1 VIEW COMPARISON:  10/23/2015 and 10/22/2015 FINDINGS: PICC, NG tube and endotracheal tube appear in good position. Slightly decreased pulmonary edema and pleural effusions. IMPRESSION: Slight decrease in the pulmonary edema and pleural effusions. Electronically Signed   By: Francene BoyersJames  Maxwell M.D.   On: 10/27/2015 07:54    ASSESSMENT / PLAN:  Acute on chronic hypercarbic and hypoxic respiratory failure - multifactorial in setting decompensated combined heart failure, pulmonary HTN, severe COPD likely c/b oversedation at home.  Has had multiple recent admissions requiring intubation.  Readmitted despite d/c on trilogy. Now intubated post VFib arrest.  Currently on 60% FiO2.   PLAN -  Vent support - 8cc/kg  Plan for trache in am c/o Dr. Ezzard StandingNewman ENT.  Start ATC or PST post trache.  Keep O2 sats > 88%.  Cont diuresis. Cards following  BD's with Pulmicort BID and Duoneb QID (unless with HR issues; can switch duoneb to atrovent) Wean off sedation.   Pollie MeyerJ. Angelo A de Dios, MD 10/27/2015, 11:23 AM  Pulmonary and Critical Care Pager [phone] After 3 pm or if no answer, call [phone]

## 2015-10-27 NOTE — Progress Notes (Signed)
On po amiodarone with taper. Awaiting repeat echo results. Will provide further guidance based on this. Plan was for trach today.  Chrystie NoseKenneth C. Hilty, MD, [facility] Attending Cardiologist [facility]

## 2015-10-28 ENCOUNTER — Encounter
Admission: RE | Disposition: A | Discharge status: Skilled Nursing Facility | Payer: Self-pay | Source: Home / Self Care | Attending: Internal Medicine

## 2015-10-28 ENCOUNTER — Encounter: Payer: Self-pay | Admitting: Certified Registered Nurse Anesthetist

## 2015-10-28 ENCOUNTER — Encounter (HOSPITAL_COMMUNITY): Payer: Medicare Other | Admitting: Certified Registered Nurse Anesthetist

## 2015-10-28 ENCOUNTER — Other Ambulatory Visit (HOSPITAL_COMMUNITY): Payer: Medicare Other

## 2015-10-28 ENCOUNTER — Ambulatory Visit (HOSPITAL_COMMUNITY): Payer: Medicare Other | Admitting: Certified Registered Nurse Anesthetist

## 2015-10-28 HISTORY — PX: TRACHEOSTOMY TUBE PLACEMENT: SHX814

## 2015-10-28 LAB — CBC
HCT: 28.9 % — ABNORMAL LOW (ref 39.0–52.0)
HEMOGLOBIN: 9 g/dL — AB (ref 13.0–17.0)
MCH: 28.5 pg (ref 26.0–34.0)
MCHC: 31.1 g/dL (ref 30.0–36.0)
MCV: 91.5 fL (ref 78.0–100.0)
PLATELETS: 155 10*3/uL (ref 150–400)
RBC: 3.16 MIL/uL — AB (ref 4.22–5.81)
RDW: 15.8 % — ABNORMAL HIGH (ref 11.5–15.5)
WBC: 9.9 10*3/uL (ref 4.0–10.5)

## 2015-10-28 LAB — BASIC METABOLIC PANEL
ANION GAP: 10 (ref 5–15)
BUN: 35 mg/dL — AB (ref 6–20)
CHLORIDE: 95 mmol/L — AB (ref 101–111)
CO2: 31 mmol/L (ref 22–32)
Calcium: 8.8 mg/dL — ABNORMAL LOW (ref 8.9–10.3)
Creatinine, Ser: 1.56 mg/dL — ABNORMAL HIGH (ref 0.61–1.24)
GFR, EST AFRICAN AMERICAN: 49 mL/min — AB (ref >60)
GFR, EST NON AFRICAN AMERICAN: 43 mL/min — AB (ref >60)
Glucose, Bld: 136 mg/dL — ABNORMAL HIGH (ref 65–99)
POTASSIUM: 3.6 mmol/L (ref 3.5–5.1)
SODIUM: 136 mmol/L (ref 135–145)

## 2015-10-28 LAB — PROTIME-INR
INR: 1.23
PROTHROMBIN TIME: 15.6 s — AB (ref 11.4–15.2)

## 2015-10-28 SURGERY — CREATION, TRACHEOSTOMY
Anesthesia: General | Site: Neck

## 2015-10-28 MED ORDER — LACTATED RINGERS IV SOLN
INTRAVENOUS | Status: DC | PRN
  Administered 2015-10-28: 12:00:00 via INTRAVENOUS

## 2015-10-28 MED ORDER — FENTANYL CITRATE (PF) 100 MCG/2ML IJ SOLN
INTRAMUSCULAR | Status: DC | PRN
  Administered 2015-10-28: 50 ug via INTRAVENOUS

## 2015-10-28 MED ORDER — MIDAZOLAM HCL 5 MG/5ML IJ SOLN
INTRAMUSCULAR | Status: DC | PRN
  Administered 2015-10-28: 2 mg via INTRAVENOUS

## 2015-10-28 MED ORDER — EPHEDRINE SULFATE 50 MG/ML IJ SOLN
INTRAMUSCULAR | Status: DC | PRN
  Administered 2015-10-28: 10 mg via INTRAVENOUS
  Administered 2015-10-28: 15 mg via INTRAVENOUS

## 2015-10-28 MED ORDER — MIDAZOLAM HCL 2 MG/2ML IJ SOLN
INTRAMUSCULAR | Status: AC
  Filled 2015-10-28: qty 2

## 2015-10-28 MED ORDER — 0.9 % SODIUM CHLORIDE (POUR BTL) OPTIME
TOPICAL | Status: DC | PRN
  Administered 2015-10-28: 1000 mL

## 2015-10-28 MED ORDER — PROPOFOL 10 MG/ML IV BOLUS
INTRAVENOUS | Status: DC | PRN
  Administered 2015-10-28: 100 mg via INTRAVENOUS

## 2015-10-28 MED ORDER — ROCURONIUM BROMIDE 100 MG/10ML IV SOLN
INTRAVENOUS | Status: DC | PRN
  Administered 2015-10-28: 50 mg via INTRAVENOUS

## 2015-10-28 MED ORDER — FENTANYL CITRATE (PF) 100 MCG/2ML IJ SOLN
INTRAMUSCULAR | Status: AC
  Filled 2015-10-28: qty 4

## 2015-10-28 MED ORDER — PROPOFOL 10 MG/ML IV BOLUS
INTRAVENOUS | Status: AC
  Filled 2015-10-28: qty 20

## 2015-10-28 MED ORDER — LIDOCAINE-EPINEPHRINE 1 %-1:100000 IJ SOLN
INTRAMUSCULAR | Status: DC | PRN
  Administered 2015-10-28: 4 mL

## 2015-10-28 SURGICAL SUPPLY — 45 items
ATTRACTOMAT 16X20 MAGNETIC DRP (DRAPES) IMPLANT
BLADE SURG 15 STRL LF DISP TIS (BLADE) ×1 IMPLANT
BLADE SURG 15 STRL SS (BLADE) ×2
CLEANER TIP ELECTROSURG 2X2 (MISCELLANEOUS) ×3 IMPLANT
COVER SURGICAL LIGHT HANDLE (MISCELLANEOUS) ×3 IMPLANT
DRAPE PROXIMA HALF (DRAPES) IMPLANT
ELECT COATED BLADE 2.86 ST (ELECTRODE) ×3 IMPLANT
ELECT REM PT RETURN 9FT ADLT (ELECTROSURGICAL) ×3
ELECTRODE REM PT RTRN 9FT ADLT (ELECTROSURGICAL) ×1 IMPLANT
GAUZE SPONGE 4X4 16PLY XRAY LF (GAUZE/BANDAGES/DRESSINGS) ×3 IMPLANT
GEL ULTRASOUND 20GR AQUASONIC (MISCELLANEOUS) ×3 IMPLANT
GLOVE BIO SURGEON STRL SZ 6 (GLOVE) ×3 IMPLANT
GLOVE BIO SURGEON STRL SZ7 (GLOVE) ×3 IMPLANT
GLOVE BIOGEL PI IND STRL 6.5 (GLOVE) ×2 IMPLANT
GLOVE BIOGEL PI IND STRL 7.0 (GLOVE) ×1 IMPLANT
GLOVE BIOGEL PI INDICATOR 6.5 (GLOVE) ×4
GLOVE BIOGEL PI INDICATOR 7.0 (GLOVE) ×2
GLOVE SS BIOGEL STRL SZ 7.5 (GLOVE) ×1 IMPLANT
GLOVE SUPERSENSE BIOGEL SZ 7.5 (GLOVE) ×2
GOWN STRL REUS W/ TWL LRG LVL3 (GOWN DISPOSABLE) ×2 IMPLANT
GOWN STRL REUS W/ TWL XL LVL3 (GOWN DISPOSABLE) ×1 IMPLANT
GOWN STRL REUS W/TWL LRG LVL3 (GOWN DISPOSABLE) ×4
GOWN STRL REUS W/TWL XL LVL3 (GOWN DISPOSABLE) ×2
HOLDER TRACH TUBE VELCRO 19.5 (MISCELLANEOUS) ×3 IMPLANT
KIT BASIN OR (CUSTOM PROCEDURE TRAY) ×3 IMPLANT
KIT ROOM TURNOVER OR (KITS) ×3 IMPLANT
KIT SUCTION CATH 14FR (SUCTIONS) ×3 IMPLANT
NEEDLE HYPO 25GX1X1/2 BEV (NEEDLE) ×3 IMPLANT
NS IRRIG 1000ML POUR BTL (IV SOLUTION) ×3 IMPLANT
PACK EENT II TURBAN DRAPE (CUSTOM PROCEDURE TRAY) ×3 IMPLANT
PAD ARMBOARD 7.5X6 YLW CONV (MISCELLANEOUS) ×3 IMPLANT
PENCIL BUTTON HOLSTER BLD 10FT (ELECTRODE) ×3 IMPLANT
SPONGE DRAIN TRACH 4X4 STRL 2S (GAUZE/BANDAGES/DRESSINGS) ×3 IMPLANT
SPONGE INTESTINAL PEANUT (DISPOSABLE) ×3 IMPLANT
SUT SILK 2 0 SH CR/8 (SUTURE) ×3 IMPLANT
SUT SILK 3 0 TIES 10X30 (SUTURE) IMPLANT
SYR 5ML LUER SLIP (SYRINGE) ×3 IMPLANT
SYR CONTROL 10ML LL (SYRINGE) ×3 IMPLANT
TOWEL OR 17X24 6PK STRL BLUE (TOWEL DISPOSABLE) ×3 IMPLANT
TOWEL OR 17X26 10 PK STRL BLUE (TOWEL DISPOSABLE) ×3 IMPLANT
TUBE CONNECTING 12'X1/4 (SUCTIONS) ×1
TUBE CONNECTING 12X1/4 (SUCTIONS) ×2 IMPLANT
TUBE TRACH SHILEY  6 DIST  CUF (TUBING) IMPLANT
TUBE TRACH SHILEY 10 DIST CUFF (TUBING) IMPLANT
TUBE TRACH SHILEY 8 DIST CUF (TUBING) ×3 IMPLANT

## 2015-10-28 NOTE — Anesthesia Procedure Notes (Signed)
Performed by: Adryen Cookson T       

## 2015-10-28 NOTE — Interval H&P Note (Signed)
History and Physical Interval Note:  10/28/2015 11:47 AM  Preston Benjamin  has presented today for surgery, with the diagnosis of PROLONGED INTUBATION  The various methods of treatment have been discussed with the patient and family. After consideration of risks, benefits and other options for treatment, the patient has consented to  Procedure(s): TRACHEOSTOMY (N/A) as a surgical intervention .  The patient's history has been reviewed, patient examined, no change in status, stable for surgery.  I have reviewed the patient's chart and labs.  Questions were answered to the patient's satisfaction.     Latoi Giraldo

## 2015-10-28 NOTE — H&P (View-Only) (Signed)
Reason for Consult:Eval for tracheostomy Referring Physician: [name] is an 70 y.o. male.  HPI: Patient with a long medical history recently admitted to [facility] on 9/15. History of CHF, COPD and IDDM. Recently has had multiple intubations. Has been tried on BiPAP multiple tomes but has failed and subsequently required intubation. Critical care has recommended proceeding with a tracheostomy.  Presently patient is intubated on vent and is taken to the OR for tracheostomy.   Past Medical History:  Diagnosis Date  . CAD (coronary artery disease)   . Chronic respiratory failure ([place])   . 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 COPD (chronic obstructive pulmonary disease) ([place])   . Hypertension   . IDDM (insulin dependent diabetes mellitus) ([place])   . Pulmonary hypertension ([place])     History reviewed. No pertinent surgical history.  Social History:  reports that he has quit smoking. He has never used smokeless tobacco. His alcohol and drug histories are not on file.  Allergies: No Known Allergies  Medications: I have reviewed the patient's current medications.  Results for orders placed or performed during the hospital encounter of 10/17/15 (from the past 48 hour(s))  CBC     Status: Abnormal   Collection Time: 10/28/15  5:00 AM  Result Value Ref Range   WBC 9.9 4.0 - 10.5 K/uL   RBC 3.16 (L) 4.22 - 5.81 MIL/uL   Hemoglobin 9.0 (L) 13.0 - 17.0 g/dL   HCT 28.9 (L) 39.0 - 52.0 %   MCV 91.5 78.0 - 100.0 fL   MCH 28.5 26.0 - 34.0 pg   MCHC 31.1 30.0 - 36.0 g/dL   RDW 15.8 (H) 11.5 - 15.5 %   Platelets 155 150 - 400 K/uL  Basic metabolic panel     Status: Abnormal   Collection Time: 10/28/15  5:00 AM  Result Value Ref Range   Sodium 136 135 - 145 mmol/L   Potassium 3.6 3.5 - 5.1 mmol/L   Chloride 95 (L) 101 - 111 mmol/L   CO2 31 22 - 32 mmol/L   Glucose, Bld 136 (H) 65 - 99 mg/dL   BUN 35 (H) 6 - 20 mg/dL   Creatinine, Ser 1.56 (H) 0.61 - 1.24  mg/dL   Calcium 8.8 (L) 8.9 - 10.3 mg/dL   GFR calc non Af Amer 43 (L) >60 mL/min   GFR calc Af Amer 49 (L) >60 mL/min    Comment: (NOTE) The eGFR has been calculated using the CKD EPI equation. This calculation has not been validated in all clinical situations. eGFR's persistently <60 mL/min signify possible Chronic Kidney Disease.    Anion gap 10 5 - 15  Protime-INR     Status: Abnormal   Collection Time: 10/28/15  5:00 AM  Result Value Ref Range   Prothrombin Time 15.6 (H) 11.4 - 15.2 seconds   INR 1.23     Dg Chest Port 1 View  Result Date: 10/27/2015 CLINICAL DATA:  Respiratory failure.  Pulmonary hypertension. EXAM: PORTABLE CHEST 1 VIEW COMPARISON:  10/23/2015 and 10/22/2015 FINDINGS: PICC, NG tube and endotracheal tube appear in good position. Slightly decreased pulmonary edema and pleural effusions. IMPRESSION: Slight decrease in the pulmonary edema and pleural effusions. Electronically Signed   By: Lorriane Shire M.D.   On: 10/27/2015 07:54    ROS:na   EL:[id number]. Discussed trach with patient. Neck exam trach midline with out mass  Assessment/Plan: VDRF CHF COPD IDDM  To OR for tracheostomy  Preston Benjamin 10/28/2015, 11:39 AM

## 2015-10-28 NOTE — Transfer of Care (Signed)
Immediate Anesthesia Transfer of Care Note  Patient: Preston Benjamin  Procedure(s) Performed: Procedure(s): TRACHEOSTOMY (N/A)  Patient Location: [facility]  Anesthesia Type:General  Level of Consciousness: sedated  Airway & Oxygen Therapy: Patient placed on Ventilator (see vital sign flow sheet for setting)  Post-op Assessment: Report given to RN  Post vital signs: Reviewed and stable  Last Vitals: There were no vitals filed for this visit.  Last Pain: There were no vitals filed for this visit.       Complications: No apparent anesthesia complications

## 2015-10-28 NOTE — Brief Op Note (Signed)
10/17/2015 - 10/28/2015  12:40 PM  PATIENT:  Preston Benjamin  70 y.o. male  PRE-OPERATIVE DIAGNOSIS:  PROLONGED INTUBATION  POST-OPERATIVE DIAGNOSIS:  PROLONGED INTUBATION  PROCEDURE:  Procedure(s): TRACHEOSTOMY (N/A)  SURGEON:  Surgeon(s) and Role:    * Drema Halonhristopher E Newman, MD - Primary  PHYSICIAN ASSISTANT:   ASSISTANTS: none   ANESTHESIA:   general  EBL:  No intake/output data recorded.  BLOOD ADMINISTERED:none  DRAINS: none   LOCAL MEDICATIONS USED:  LIDOCAINE with EPI 4 cc  SPECIMEN:  No Specimen  DISPOSITION OF SPECIMEN:  N/A  COUNTS:  YES  TOURNIQUET:  * No tourniquets in log *  DICTATION: .Other Dictation: Dictation Number [id number]  PLAN OF CARE: Discharge to home after PACU  PATIENT DISPOSITION:  PACU - hemodynamically stable.   Delay start of Pharmacological VTE agent (>24hrs) due to surgical blood loss or risk of bleeding: not applicable

## 2015-10-28 NOTE — Progress Notes (Addendum)
Patient Name: Preston Benjamin Date of Encounter: 10/28/2015  Hospital Problem List     Active Problems:   Respiratory distress   Old MI (myocardial infarction)   CAD in native artery   VF (ventricular fibrillation) (HCC)   Pulmonary hypertension (HCC)   Mitral regurgitation   Acute on chronic respiratory failure with hypoxia and hypercapnia (HCC)   Unilateral emphysema (HCC)   Acute pulmonary edema (HCC)     Subjective   Intubated.  Awake, but not following commands. Not yet trached. Echo yesterday personally reviewed, LVEF 40-45%, inferior scar with moderate to severe posteriorly directed mitral regurgitation due to ischemic tethering of the posterior mitral leaflet, Grade 2 DD with elevated LV filling pressure, RVSP 53 mmHg (on vent). There was a notable left pleural effusion.  Inpatient Medications    . amiodarone  150 mg Intravenous Once    Vital Signs    There were no vitals filed for this visit. No intake or output data in the 24 hours ending 10/28/15 1015 There were no vitals filed for this visit.  Physical Exam    GEN: Intubated, sedated, in no distress  Neck: Supple, JVP elevated to 5 cm, carotid bruits, or masses. Cardiac: RRR, no rubs, or gallops. No clubbing, cyanosis, no edema.  Radials/DP/PT 2+ and equal bilaterally.  Respiratory:  Respirations  regular and unlabored, clear to auscultation bilaterally. GI: Soft, nontender, nondistended, BS + x 4. Neuro:  Strength and sensation are intact.  Labs    CBC  Recent Labs  10/28/15 0500  WBC 9.9  HGB 9.0*  HCT 28.9*  MCV 91.5  PLT 155   Basic Metabolic Panel  Recent Labs  10/26/15 1005 10/28/15 0500  NA  --  136  K 3.5 3.6  CL  --  95*  CO2  --  31  GLUCOSE  --  136*  BUN  --  35*  CREATININE  --  1.56*  CALCIUM  --  8.8*   Liver Function Tests No results for input(s): AST, ALT, ALKPHOS, BILITOT, PROT, ALBUMIN in the last 72 hours. No results for input(s): LIPASE, AMYLASE in the last 72  hours. Cardiac Enzymes No results for input(s): CKTOTAL, CKMB, CKMBINDEX, TROPONINI in the last 72 hours. BNP Invalid input(s): POCBNP D-Dimer No results for input(s): DDIMER in the last 72 hours. Hemoglobin A1C No results for input(s): HGBA1C in the last 72 hours. Fasting Lipid Panel No results for input(s): CHOL, HDL, LDLCALC, TRIG, CHOLHDL, LDLDIRECT in the last 72 hours. Thyroid Function Tests No results for input(s): TSH, T4TOTAL, T3FREE, THYROIDAB in the last 72 hours.  Invalid input(s): FREET3  Telemetry    Sinus rhythm in the 60's  ECG  None to review today  Radiology    Dg Chest Port 1 View  Result Date: 10/27/2015 CLINICAL DATA:  Respiratory failure.  Pulmonary hypertension. EXAM: PORTABLE CHEST 1 VIEW COMPARISON:  10/23/2015 and 10/22/2015 FINDINGS: PICC, NG tube and endotracheal tube appear in good position. Slightly decreased pulmonary edema and pleural effusions. IMPRESSION: Slight decrease in the pulmonary edema and pleural effusions. Electronically Signed   By: Francene Boyers M.D.   On: 10/27/2015 07:54   Dg Chest Port 1 View  Result Date: 10/23/2015 CLINICAL DATA:  Status post PICC and NG tube placement today. EXAM: PORTABLE CHEST 1 VIEW COMPARISON:  Single-view of the chest earlier today. FINDINGS: Right PICC is in place with the tip projecting in the lower superior vena cava. Endotracheal tube and NG tube are noted. The  patient has extensive bilateral airspace disease which is worse on the right. There is likely a right pleural effusion. Cardiomegaly is noted. IMPRESSION: Tip of right PICC projects in the lower superior vena cava. Marked worsening in aeration since the study yesterday with extensive bilateral airspace disease and likely effusions, greater on the right, with an appearance most compatible with pulmonary edema. Electronically Signed   By: Drusilla Kanner M.D.   On: 10/23/2015 15:47   Dg Chest Port 1 View  Addendum Date: 10/22/2015   ADDENDUM  REPORT: 10/22/2015 05:40 ADDENDUM: An endotracheal tube is present with tip measuring about 4.5 cm above the carina. Visualization is limited due to motion artifact. Appears in satisfactory location. Electronically Signed   By: Burman Nieves M.D.   On: 10/22/2015 05:40   Result Date: 10/22/2015 CLINICAL DATA:  Respiratory failure. EXAM: PORTABLE CHEST 1 VIEW COMPARISON:  10/19/2015 FINDINGS: Cardiac enlargement. Atelectasis in the lung bases. Can't exclude consolidation in the left lung base behind the heart. Probable small pleural effusions. Infiltration seen previously in the right upper lung has resolved. No pneumothorax. Calcification of the aorta. Degenerative changes in the spine. IMPRESSION: Cardiac enlargement with atelectasis in the lung bases. Probable bilateral small pleural effusions. Possible consolidation in the left lung base behind the heart. Electronically Signed: By: Burman Nieves M.D. On: 10/22/2015 01:44   Dg Chest Port 1 View  Result Date: 10/19/2015 CLINICAL DATA:  Respiratory failure. Pt unable to relate history at this time. EXAM: PORTABLE CHEST 1 VIEW COMPARISON:  None. FINDINGS: Normal cardiac silhouette. Band of atelectasis in the LEFT lower lobe. Diffuse airspace disease in the RIGHT upper lobe and RIGHT lower lobe. No pneumothorax. IMPRESSION: 1. Diffuse airspace disease in the RIGHT upper lobe and RIGHT lower lobe suggests asymmetric edema versus multifocal pneumonia. 2. LEFT lower lobe atelectasis. Electronically Signed   By: Genevive Bi M.D.   On: 10/19/2015 09:52   Dg Abd Portable 1v  Result Date: 10/23/2015 CLINICAL DATA:  PICC line and NG tube placement EXAM: PORTABLE ABDOMEN - 1 VIEW COMPARISON:  None. FINDINGS: There is normal small bowel gas pattern. There is hazy airspace disease in right lung. Asymmetric edema or infiltrate cannot be excluded. NG tube is noted coiled within stomach with tip in proximal stomach. Partially visualized right PICC line with tip  in distal SVC. IMPRESSION: There is hazy airspace disease in right lung. Asymmetric edema or infiltrate cannot be excluded. NG tube is noted coiled within stomach with tip in proximal stomach. Partially visualized right PICC line with tip in distal SVC. Electronically Signed   By: Natasha Mead M.D.   On: 10/23/2015 15:44   ECHO (10/27/2015):  LV EF: 40% -   45%  ------------------------------------------------------------------- Indications:      CHF - 428.0.  ------------------------------------------------------------------- History:   PMH:  V fib arrest. Respiratory distress. Old MI. CAD. Pulmonary hypertension.  ------------------------------------------------------------------- Study Conclusions  - Left ventricle: The cavity size was normal. Wall thickness was   increased in a pattern of mild LVH. Systolic function was mildly   to moderately reduced. The estimated ejection fraction was in the   range of 40% to 45%. Inferior akinesis with enhanced   echogenicity, suggestive of scar. Doppler parameters are   consistent with pseudonormal left ventricular relaxation (grade 2   diastolic dysfunction). The E/A ratio is >2. The E/e&' ratio is   >20, suggesting markedly elevated LV filling pressure. - Mitral valve: Ischemic tethering of the posterior leaflet.   Moderate to severe  eccentric mitral regurgitation directed   posteriorly. - Left atrium: The atrium was mildly dilated. - Right ventricle: The cavity size was normal. Systolic function   was low normal. - Right atrium: The atrium was mildly dilated. - Atrial septum: No defect or patent foramen ovale was identified. - Tricuspid valve: There was mild regurgitation. - Pulmonary arteries: PA peak pressure: 53 mm Hg (S). - Inferior vena cava: The vessel was dilated. The respirophasic   diameter changes were blunted (< 50%), consistent with elevated   central venous pressure. - Pericardium, extracardiac: There was no pericardial  effusion.   There was a left pleural effusion.  Impressions:  - LVEF 40-45%, mild LVH, inferior akinesis and scar, grade 2 DD   with high LV filling pressure, ischemic tethering of the   posterior mitral leaflet with moderate to severe posteriorly   directed mitral regurgitation, mild biatrial enlargement, mild   TR, RVSP 53 mmHg, dilated IVC, left pleural effusion.   Assessment & Plan    1. VFIB ARREST:  No further arrhythmia.  Remains intubated - awaiting trach. Continue IV amiodarone. 2. ISCHEMIC CARDIOMYOPATHY:  Echo shows volume overload with high LV and RV filling pressures. LVEF 40-45% with moderate to severe eccentric MR and inferior scar. This is likely the source of his arrhythmia. Would recommend more aggressive diuresis. Can work to optimize CHF medications once he is able to take po meds. 3. CHRONIC RESPIRATORY FAILURE: Suspect decompensated CHF is what is making it difficult to extubate him. Will need more aggressive diuresis.  CRITICAL CARE:  The patient is critically ill with multi-organ system failure and requires high complexity decision making for assessment and support, frequent evaluation and titration of therapies, application of advanced monitoring technologies and extensive interpretation of multiple databases.  TIME SPENT DIRECTLY WITH THE PATIENT: 30 minutes  Chrystie NoseKenneth C. Amaris Garrette, MD, [facility]ACC Attending Cardiologist [facility]  Chrystie NoseKenneth C Cleo Santucci, MD  10/28/2015, 10:15 AM

## 2015-10-28 NOTE — Anesthesia Postprocedure Evaluation (Signed)
Anesthesia Post Note  Patient: Preston SandersBobby Pho  Procedure(s) Performed: Procedure(s) (LRB): TRACHEOSTOMY (N/A)  Patient location during evaluation: PACU Anesthesia Type: General Level of consciousness: patient remains intubated per anesthesia plan Pain management: satisfactory to patient Vital Signs Assessment: post-procedure vital signs reviewed and stable Respiratory status: patient remains intubated per anesthesia plan Cardiovascular status: stable Anesthetic complications: no    Last Vitals: There were no vitals filed for this visit.  Last Pain: There were no vitals filed for this visit.               Jiles GarterJACKSON,Yarieliz Wasser EDWARD

## 2015-10-28 NOTE — Anesthesia Preprocedure Evaluation (Addendum)
Anesthesia Evaluation  Patient identified by MRN, date of birth, ID band Patient awake    Reviewed: Allergy & Precautions, H&P , Patient's Chart, lab work & pertinent test results, reviewed documented beta blocker date and time   Airway Mallampati: II  TM Distance: >3 FB Neck ROM: full    Dental no notable dental hx.    Pulmonary COPD, former smoker,    + rhonchi  + wheezing  + intubated    Cardiovascular hypertension,  Rhythm:regular Rate:Normal     Neuro/Psych    GI/Hepatic   Endo/Other  diabetes  Renal/GU      Musculoskeletal   Abdominal   Peds  Hematology  (+) anemia ,   Anesthesia Other Findings Scant wheezes  Reproductive/Obstetrics                            Anesthesia Physical Anesthesia Plan  ASA: IV  Anesthesia Plan: General   Post-op Pain Management:    Induction: Intravenous  Airway Management Planned: Oral ETT and Tracheostomy  Additional Equipment:   Intra-op Plan:   Post-operative Plan: Post-operative intubation/ventilation  Informed Consent: I have reviewed the patients History and Physical, chart, labs and discussed the procedure including the risks, benefits and alternatives for the proposed anesthesia with the patient or authorized representative who has indicated his/her understanding and acceptance.   Dental Advisory Given  Plan Discussed with: CRNA and Surgeon  Anesthesia Plan Comments: (  Discussed general anesthesia, including possible nausea, instrumentation of airway, sore throat,pulmonary aspiration, etc. I asked if the were any outstanding questions, or  concerns before we proceeded. )       Anesthesia Quick Evaluation

## 2015-10-28 NOTE — Consult Note (Signed)
Reason for Consult:Eval for tracheostomy Referring Physician: [name] is an 70 y.o. male.  HPI: Patient with a long medical history recently admitted to [facility] on 9/15. History of CHF, COPD and IDDM. Recently has had multiple intubations. Has been tried on BiPAP multiple tomes but has failed and subsequently required intubation. Critical care has recommended proceeding with a tracheostomy.  Presently patient is intubated on vent and is taken to the OR for tracheostomy.   Past Medical History:  Diagnosis Date  . CAD (coronary artery disease)   . Chronic respiratory failure ([place])   . 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 COPD (chronic obstructive pulmonary disease) ([place])   . Hypertension   . IDDM (insulin dependent diabetes mellitus) ([place])   . Pulmonary hypertension ([place])     History reviewed. No pertinent surgical history.  Social History:  reports that he has quit smoking. He has never used smokeless tobacco. His alcohol and drug histories are not on file.  Allergies: No Known Allergies  Medications: I have reviewed the patient's current medications.  Results for orders placed or performed during the hospital encounter of 10/17/15 (from the past 48 hour(s))  CBC     Status: Abnormal   Collection Time: 10/28/15  5:00 AM  Result Value Ref Range   WBC 9.9 4.0 - 10.5 K/uL   RBC 3.16 (L) 4.22 - 5.81 MIL/uL   Hemoglobin 9.0 (L) 13.0 - 17.0 g/dL   HCT 28.9 (L) 39.0 - 52.0 %   MCV 91.5 78.0 - 100.0 fL   MCH 28.5 26.0 - 34.0 pg   MCHC 31.1 30.0 - 36.0 g/dL   RDW 15.8 (H) 11.5 - 15.5 %   Platelets 155 150 - 400 K/uL  Basic metabolic panel     Status: Abnormal   Collection Time: 10/28/15  5:00 AM  Result Value Ref Range   Sodium 136 135 - 145 mmol/L   Potassium 3.6 3.5 - 5.1 mmol/L   Chloride 95 (L) 101 - 111 mmol/L   CO2 31 22 - 32 mmol/L   Glucose, Bld 136 (H) 65 - 99 mg/dL   BUN 35 (H) 6 - 20 mg/dL   Creatinine, Ser 1.56 (H) 0.61 - 1.24  mg/dL   Calcium 8.8 (L) 8.9 - 10.3 mg/dL   GFR calc non Af Amer 43 (L) >60 mL/min   GFR calc Af Amer 49 (L) >60 mL/min    Comment: (NOTE) The eGFR has been calculated using the CKD EPI equation. This calculation has not been validated in all clinical situations. eGFR's persistently <60 mL/min signify possible Chronic Kidney Disease.    Anion gap 10 5 - 15  Protime-INR     Status: Abnormal   Collection Time: 10/28/15  5:00 AM  Result Value Ref Range   Prothrombin Time 15.6 (H) 11.4 - 15.2 seconds   INR 1.23     Dg Chest Port 1 View  Result Date: 10/27/2015 CLINICAL DATA:  Respiratory failure.  Pulmonary hypertension. EXAM: PORTABLE CHEST 1 VIEW COMPARISON:  10/23/2015 and 10/22/2015 FINDINGS: PICC, NG tube and endotracheal tube appear in good position. Slightly decreased pulmonary edema and pleural effusions. IMPRESSION: Slight decrease in the pulmonary edema and pleural effusions. Electronically Signed   By: Lorriane Shire M.D.   On: 10/27/2015 07:54    ROS:na   HW:[id number]. Discussed trach with patient. Neck exam trach midline with out mass  Assessment/Plan: VDRF CHF COPD IDDM  To OR for tracheostomy  Preston Benjamin 10/28/2015, 11:39 AM

## 2015-10-29 ENCOUNTER — Encounter (HOSPITAL_COMMUNITY): Payer: Self-pay | Admitting: Otolaryngology

## 2015-10-29 ENCOUNTER — Other Ambulatory Visit (HOSPITAL_COMMUNITY): Payer: Medicare Other

## 2015-10-29 DIAGNOSIS — J9621 Acute and chronic respiratory failure with hypoxia: Secondary | ICD-10-CM | POA: Diagnosis not present

## 2015-10-29 DIAGNOSIS — J9622 Acute and chronic respiratory failure with hypercapnia: Secondary | ICD-10-CM | POA: Diagnosis not present

## 2015-10-29 DIAGNOSIS — Z4659 Encounter for fitting and adjustment of other gastrointestinal appliance and device: Secondary | ICD-10-CM

## 2015-10-29 LAB — HEMOGLOBIN AND HEMATOCRIT, BLOOD
HEMATOCRIT: 30 % — AB (ref 39.0–52.0)
HEMOGLOBIN: 9.5 g/dL — AB (ref 13.0–17.0)

## 2015-10-29 LAB — BASIC METABOLIC PANEL
ANION GAP: 11 (ref 5–15)
BUN: 37 mg/dL — ABNORMAL HIGH (ref 6–20)
CALCIUM: 8.7 mg/dL — AB (ref 8.9–10.3)
CO2: 28 mmol/L (ref 22–32)
Chloride: 95 mmol/L — ABNORMAL LOW (ref 101–111)
Creatinine, Ser: 1.67 mg/dL — ABNORMAL HIGH (ref 0.61–1.24)
GFR calc Af Amer: 46 mL/min — ABNORMAL LOW (ref >60)
GFR, EST NON AFRICAN AMERICAN: 39 mL/min — AB (ref >60)
GLUCOSE: 206 mg/dL — AB (ref 65–99)
POTASSIUM: 4 mmol/L (ref 3.5–5.1)
SODIUM: 134 mmol/L — AB (ref 135–145)

## 2015-10-29 NOTE — Progress Notes (Signed)
Patient Name: Preston SandersBobby Benjamin Date of Encounter: 10/29/2015  Hospital Problem List     Active Problems:   Respiratory distress   Old MI (myocardial infarction)   CAD in native artery   VF (ventricular fibrillation) (HCC)   Pulmonary hypertension (HCC)   Mitral regurgitation   Acute on chronic respiratory failure with hypoxia and hypercapnia (HCC)   Unilateral emphysema (HCC)   Acute pulmonary edema (HCC)   Encounter for nasogastric (NG) tube placement     Subjective   Sedated on vent. On lasix gtts - decreased to 5 mg/hr.  UOP was good, however, still net positive with TF's, etc. Creatinine is trending up somewhat. CXR today shows slightly better aeration.  Inpatient Medications    . amiodarone  150 mg Intravenous Once    Vital Signs    There were no vitals filed for this visit. No intake or output data in the 24 hours ending 10/29/15 1645 There were no vitals filed for this visit.  Physical Exam    GEN: Intubated, sedated, in no distress  Neck: Supple, JVP elevated to 3 cm, carotid bruits, or masses. Cardiac: RRR, no rubs, or gallops. No clubbing, cyanosis, no edema.  Radials/DP/PT 2+ and equal bilaterally.  Respiratory:  Respirations  regular and unlabored, clear to auscultation bilaterally. GI: Soft, nontender, nondistended, BS + x 4. Neuro:  MAEW  Labs    CBC  Recent Labs  10/28/15 0500 10/29/15 0500  WBC 9.9  --   HGB 9.0* 9.5*  HCT 28.9* 30.0*  MCV 91.5  --   PLT 155  --    Basic Metabolic Panel  Recent Labs  10/28/15 0500 10/29/15 0500  NA 136 134*  K 3.6 4.0  CL 95* 95*  CO2 31 28  GLUCOSE 136* 206*  BUN 35* 37*  CREATININE 1.56* 1.67*  CALCIUM 8.8* 8.7*   Liver Function Tests No results for input(s): AST, ALT, ALKPHOS, BILITOT, PROT, ALBUMIN in the last 72 hours. No results for input(s): LIPASE, AMYLASE in the last 72 hours. Cardiac Enzymes No results for input(s): CKTOTAL, CKMB, CKMBINDEX, TROPONINI in the last 72  hours. BNP Invalid input(s): POCBNP D-Dimer No results for input(s): DDIMER in the last 72 hours. Hemoglobin A1C No results for input(s): HGBA1C in the last 72 hours. Fasting Lipid Panel No results for input(s): CHOL, HDL, LDLCALC, TRIG, CHOLHDL, LDLDIRECT in the last 72 hours. Thyroid Function Tests No results for input(s): TSH, T4TOTAL, T3FREE, THYROIDAB in the last 72 hours.  Invalid input(s): FREET3  Telemetry    Sinus rhythm in the 60's  ECG  None to review today  Radiology    Dg Chest Port 1 View  Result Date: 10/29/2015 CLINICAL DATA:  Acute hypoxia chest x-ray of 10/27/2015 EXAM: PORTABLE CHEST 1 VIEW COMPARISON:  None. FINDINGS: The lungs appear slightly better aerated with decreasing bibasilar volume loss. No pneumonia or effusion is seen. Tracheostomy, right central venous line, and NG tube remain. Heart size is stable. IMPRESSION: Slightly better aeration.  Decreased basilar atelectasis. Electronically Signed   By: Dwyane DeePaul  Barry M.D.   On: 10/29/2015 08:01   Dg Chest Port 1 View  Result Date: 10/27/2015 CLINICAL DATA:  Respiratory failure.  Pulmonary hypertension. EXAM: PORTABLE CHEST 1 VIEW COMPARISON:  10/23/2015 and 10/22/2015 FINDINGS: PICC, NG tube and endotracheal tube appear in good position. Slightly decreased pulmonary edema and pleural effusions. IMPRESSION: Slight decrease in the pulmonary edema and pleural effusions. Electronically Signed   By: Francene BoyersJames  Maxwell M.D.  On: 10/27/2015 07:54   Dg Chest Port 1 View  Result Date: 10/23/2015 CLINICAL DATA:  Status post PICC and NG tube placement today. EXAM: PORTABLE CHEST 1 VIEW COMPARISON:  Single-view of the chest earlier today. FINDINGS: Right PICC is in place with the tip projecting in the lower superior vena cava. Endotracheal tube and NG tube are noted. The patient has extensive bilateral airspace disease which is worse on the right. There is likely a right pleural effusion. Cardiomegaly is noted. IMPRESSION: Tip  of right PICC projects in the lower superior vena cava. Marked worsening in aeration since the study yesterday with extensive bilateral airspace disease and likely effusions, greater on the right, with an appearance most compatible with pulmonary edema. Electronically Signed   By: Drusilla Kanner M.D.   On: 10/23/2015 15:47   Dg Chest Port 1 View  Addendum Date: 10/22/2015   ADDENDUM REPORT: 10/22/2015 05:40 ADDENDUM: An endotracheal tube is present with tip measuring about 4.5 cm above the carina. Visualization is limited due to motion artifact. Appears in satisfactory location. Electronically Signed   By: Burman Nieves M.D.   On: 10/22/2015 05:40   Result Date: 10/22/2015 CLINICAL DATA:  Respiratory failure. EXAM: PORTABLE CHEST 1 VIEW COMPARISON:  10/19/2015 FINDINGS: Cardiac enlargement. Atelectasis in the lung bases. Can't exclude consolidation in the left lung base behind the heart. Probable small pleural effusions. Infiltration seen previously in the right upper lung has resolved. No pneumothorax. Calcification of the aorta. Degenerative changes in the spine. IMPRESSION: Cardiac enlargement with atelectasis in the lung bases. Probable bilateral small pleural effusions. Possible consolidation in the left lung base behind the heart. Electronically Signed: By: Burman Nieves M.D. On: 10/22/2015 01:44   Dg Chest Port 1 View  Result Date: 10/19/2015 CLINICAL DATA:  Respiratory failure. Pt unable to relate history at this time. EXAM: PORTABLE CHEST 1 VIEW COMPARISON:  None. FINDINGS: Normal cardiac silhouette. Band of atelectasis in the LEFT lower lobe. Diffuse airspace disease in the RIGHT upper lobe and RIGHT lower lobe. No pneumothorax. IMPRESSION: 1. Diffuse airspace disease in the RIGHT upper lobe and RIGHT lower lobe suggests asymmetric edema versus multifocal pneumonia. 2. LEFT lower lobe atelectasis. Electronically Signed   By: Genevive Bi M.D.   On: 10/19/2015 09:52   Dg Abd Portable  1v  Result Date: 10/23/2015 CLINICAL DATA:  PICC line and NG tube placement EXAM: PORTABLE ABDOMEN - 1 VIEW COMPARISON:  None. FINDINGS: There is normal small bowel gas pattern. There is hazy airspace disease in right lung. Asymmetric edema or infiltrate cannot be excluded. NG tube is noted coiled within stomach with tip in proximal stomach. Partially visualized right PICC line with tip in distal SVC. IMPRESSION: There is hazy airspace disease in right lung. Asymmetric edema or infiltrate cannot be excluded. NG tube is noted coiled within stomach with tip in proximal stomach. Partially visualized right PICC line with tip in distal SVC. Electronically Signed   By: Natasha Mead M.D.   On: 10/23/2015 15:44   ECHO (10/27/2015):  LV EF: 40% -   45%  ------------------------------------------------------------------- Indications:      CHF - 428.0.  ------------------------------------------------------------------- History:   PMH:  V fib arrest. Respiratory distress. Old MI. CAD. Pulmonary hypertension.  ------------------------------------------------------------------- Study Conclusions  - Left ventricle: The cavity size was normal. Wall thickness was   increased in a pattern of mild LVH. Systolic function was mildly   to moderately reduced. The estimated ejection fraction was in the   range of  40% to 45%. Inferior akinesis with enhanced   echogenicity, suggestive of scar. Doppler parameters are   consistent with pseudonormal left ventricular relaxation (grade 2   diastolic dysfunction). The E/A ratio is >2. The E/e&' ratio is   >20, suggesting markedly elevated LV filling pressure. - Mitral valve: Ischemic tethering of the posterior leaflet.   Moderate to severe eccentric mitral regurgitation directed   posteriorly. - Left atrium: The atrium was mildly dilated. - Right ventricle: The cavity size was normal. Systolic function   was low normal. - Right atrium: The atrium was mildly  dilated. - Atrial septum: No defect or patent foramen ovale was identified. - Tricuspid valve: There was mild regurgitation. - Pulmonary arteries: PA peak pressure: 53 mm Hg (S). - Inferior vena cava: The vessel was dilated. The respirophasic   diameter changes were blunted (< 50%), consistent with elevated   central venous pressure. - Pericardium, extracardiac: There was no pericardial effusion.   There was a left pleural effusion.  Impressions:  - LVEF 40-45%, mild LVH, inferior akinesis and scar, grade 2 DD   with high LV filling pressure, ischemic tethering of the   posterior mitral leaflet with moderate to severe posteriorly   directed mitral regurgitation, mild biatrial enlargement, mild   TR, RVSP 53 mmHg, dilated IVC, left pleural effusion.   Assessment & Plan    1. VFIB ARREST:  No further arrhythmia.  Remains intubated - s/p trach. Continue IV amiodarone until he is able to take po. 2. ISCHEMIC CARDIOMYOPATHY:  Echo shows volume overload with high LV and RV filling pressures. LVEF 40-45% with moderate to severe eccentric MR and inferior scar. This is likely the source of his arrhythmia. Responding to lasix gtts - still net positive with tube feeds, however, creatinine is trending up somewhat. Lasix gtts decreased to 5 mg/hr. Follow daily weights. 3. CHRONIC RESPIRATORY FAILURE: Suspect decompensated CHF is what is making it difficult to extubate him. Continue diuresis. Vent wean per pulmonary.  Chrystie Nose, MD, [facility] Attending Cardiologist [facility]  Chrystie Nose, MD  10/29/2015, 4:45 PM

## 2015-10-29 NOTE — Progress Notes (Signed)
Name: Preston Benjamin MRN: [id number] DOB: [birth year]    ADMISSION DATE:  10/17/2015 CONSULTATION DATE:  9/18  REFERRING MD :  [facility] ([facility])   CHIEF COMPLAINT:  Respiratory failure   BRIEF PATIENT DESCRIPTION: 70yo male with hx combined heart failure, severe COPD, HTN, DM with [facility] for acute on chronic respiratory failure r/t AECOPD and decompensated heart failure.  Many of these admissions have required intubation.  On previous d/c 9/1 he was sent home with trilogy bipap.  He unfortunately continued to have increased somnolence and SOB and was admitted again 9/5, again requiring intubation.  During that admission he was found to have worsening pulmonary HTN with PASP 68mmHg as well as significant bilateral pleural effusions s/p thoracentesis (transudate).  He was diuresed and treated for AECOPD and again extubated but continued to required high flow O2 and was d/c to [facility] 9[facility].  PCCM consulted to assist as he continues to require vapotherm 30LPM.    SIGNIFICANT EVENTS / STUDIES: 9/20 - V fib arrest brief w/ patient reintubated 9/26 > trach by ENT. 9/26  Echo > EF40-45%, mod to severe MR, G2DD.   SUBJECTIVE:  Had trach yesterday 9/26, tolerated well. Too somnolent to start weaning trials this AM.   VITAL SIGNS:  65, 18, 133/72, 100%  PHYSICAL EXAMINATION:  General:  Chronically ill appearing male, NAD on vent via trach. Neuro:  Sedated RASS -3 HEENT:  Mm moist, trach C/D/I Cardiovascular:  s1s2 rrr Lungs:  resps even non labored on vent, diminished bases. Some crackles at bases.  Abdomen:  Round, soft, +bs  Musculoskeletal:  Warm and dry, scant BLE edema    Recent Labs Lab 10/25/15 0550 10/26/15 1005 10/28/15 0500 10/29/15 0500  NA 134*  --  136 134*  K 3.3* 3.5 3.6 4.0  CL 91*  --  95* 95*  CO2 37*  --  31 28  BUN 26*  --  35* 37*  CREATININE 1.26*  --  1.56* 1.67*  GLUCOSE 159*  --  136* 206*    Recent Labs Lab 10/24/15 1058  10/28/15 0500 10/29/15 0500  HGB 8.3* 9.0* 9.5*  HCT 26.3* 28.9* 30.0*  WBC 9.9 9.9  --   PLT 189 155  --    Dg Chest Port 1 View  Result Date: 10/29/2015 CLINICAL DATA:  Acute hypoxia chest x-ray of 10/27/2015 EXAM: PORTABLE CHEST 1 VIEW COMPARISON:  None. FINDINGS: The lungs appear slightly better aerated with decreasing bibasilar volume loss. No pneumonia or effusion is seen. Tracheostomy, right central venous line, and NG tube remain. Heart size is stable. IMPRESSION: Slightly better aeration.  Decreased basilar atelectasis. Electronically Signed   By: Dwyane DeePaul  Barry M.D.   On: 10/29/2015 08:01    ASSESSMENT / PLAN:  Acute on chronic hypercarbic and hypoxic respiratory failure - multifactorial in setting decompensated combined heart failure, pulmonary HTN, severe COPD likely c/b oversedation at home.  Has had multiple recent admissions requiring intubation.  Readmitted despite d/c on trilogy. Now intubated post VFib arrest and required tracheostomy 9/26 due to inability to wean.  PLAN -  Vent support - 8cc/kg  Start weaning as able per usual protocol (have placed order) Minimize / wean sedation to facilitate weaning Cont diuresis. Cards following and recommending being more aggressive.  BD's with Pulmicort BID and Duoneb QID.   CC time: 30 minutes.   Rutherford Guysahul Desai, PA - [facility] Pager: [phone]  or [phone]  [phone] 10/29/2015, 10:48 AM  Attending note: I have seen and examined the patient with nurse practitioner/resident and agree with the note. History, labs and imaging reviewed. S/p trach yesterday without any issues Start weaning trials when mental status permits. Rest of plan as above.  [name] MD [place] [facility] Pager [phone] If no answer or after 3pm call: [phone] 10/29/2015, 12:44 PM

## 2015-10-29 NOTE — Op Note (Signed)
NAMSerita Kyle:  Robb, Kaegan                 ACCOUNT NO.:  [id number]  MEDICAL RECORD NO.:  [id number]  LOCATION:                                 FACILITY:  PHYSICIAN:  Kristine GarbeChristopher E. Ezzard StandingNewman, M.D.DATE OF BIRTH:  10/16/1945  DATE OF PROCEDURE:  10/28/2015 DATE OF DISCHARGE:                              OPERATIVE REPORT   PREOPERATIVE DIAGNOSIS:  Ventilation dependent respiratory failure.  POSTOPERATIVE DIAGNOSIS:  Ventilation dependent respiratory failure.  OPERATION PERFORMED:  Via tracheostomy with a #8 Shiley.  SURGEON:  Kristine GarbeChristopher E. Ezzard StandingNewman, MD.  ANESTHESIA:  General endotracheal.  COMPLICATIONS:  None.  BRIEF CLINICAL NOTE:  __________ is a 70 year old gentleman who has had a long history of COPD, congestive heart failure, insulin dependent diabetes.  He has had multiple hospitalizations with requirements of intubation.  [facility] was [facility], and was tried on BiPAP, but failed BiPAP and required intubation.  He was subsequently extubated but failed extubation with respiratory arrest, and was subsequently intubated several days ago. Critical Care recommended performing a tracheostomy, and patient is taken to the operating room at this time for tracheostomy.  DESCRIPTION OF PROCEDURE:  The patient was brought directly [facility] to the operating room, was remained in his bed, a roll was placed under the shoulders to extend his neck.  A vertical incision was made overlying the trachea after injecting 3 mL of Xylocaine with epinephrine and applying Betadine.  Dissection was carried down, the midline strap muscles were reflected laterally, divided it midline.  The thyroid isthmus covered the first 2 to 3 tracheal rings.  The thyroid isthmus was divided.  The first 3 tracheal rings were exposed and a horizontal tracheotomy was performed between the first and second tracheal rings below the cricoid cartilage.   The endotracheal tube was removed and a #8 Shiley tracheotomy tube was inserted without any difficulty. The balloon was inflated, the patient was ventilated well with the new #8 Shiley trach in place.  It was secured to the neck with 2-0 silk sutures x4 and a Velcro trach collar around the neck.  The patient was subsequently [facility].    ______________________________ Kristine Garbehristopher E. Ezzard StandingNewman, M.D.   ______________________________ Kristine Garbehristopher E. Ezzard StandingNewman, M.D.    CEN/MEDQ  D:  10/28/2015  T:  10/29/2015  Job:  [id number]  cc:   Kristine Garbehristopher E. Ezzard StandingNewman, M.D.

## 2015-10-30 DIAGNOSIS — J9611 Chronic respiratory failure with hypoxia: Secondary | ICD-10-CM

## 2015-10-31 ENCOUNTER — Encounter (HOSPITAL_COMMUNITY): Payer: Self-pay | Admitting: Otolaryngology

## 2015-10-31 ENCOUNTER — Other Ambulatory Visit (HOSPITAL_COMMUNITY): Payer: Medicare Other

## 2015-10-31 LAB — CBC
HCT: 27.7 % — ABNORMAL LOW (ref 39.0–52.0)
HEMOGLOBIN: 8.6 g/dL — AB (ref 13.0–17.0)
MCH: 28.7 pg (ref 26.0–34.0)
MCHC: 31 g/dL (ref 30.0–36.0)
MCV: 92.3 fL (ref 78.0–100.0)
Platelets: 147 10*3/uL — ABNORMAL LOW (ref 150–400)
RBC: 3 MIL/uL — AB (ref 4.22–5.81)
RDW: 15.5 % (ref 11.5–15.5)
WBC: 12.9 10*3/uL — AB (ref 4.0–10.5)

## 2015-10-31 LAB — BASIC METABOLIC PANEL
ANION GAP: 9 (ref 5–15)
ANION GAP: 8 (ref 5–15)
BUN: 51 mg/dL — AB (ref 6–20)
BUN: 52 mg/dL — ABNORMAL HIGH (ref 6–20)
CHLORIDE: 99 mmol/L — AB (ref 101–111)
CHLORIDE: 99 mmol/L — AB (ref 101–111)
CO2: 31 mmol/L (ref 22–32)
CO2: 31 mmol/L (ref 22–32)
Calcium: 9 mg/dL (ref 8.9–10.3)
Calcium: 9 mg/dL (ref 8.9–10.3)
Creatinine, Ser: 1.79 mg/dL — ABNORMAL HIGH (ref 0.61–1.24)
Creatinine, Ser: 1.81 mg/dL — ABNORMAL HIGH (ref 0.61–1.24)
GFR calc non Af Amer: 36 mL/min — ABNORMAL LOW (ref >60)
GFR calc non Af Amer: 36 mL/min — ABNORMAL LOW (ref >60)
GFR, EST AFRICAN AMERICAN: 42 mL/min — AB (ref >60)
GFR, EST AFRICAN AMERICAN: 41 mL/min — AB (ref >60)
Glucose, Bld: 227 mg/dL — ABNORMAL HIGH (ref 65–99)
Glucose, Bld: 238 mg/dL — ABNORMAL HIGH (ref 65–99)
POTASSIUM: 4.2 mmol/L (ref 3.5–5.1)
POTASSIUM: 4.4 mmol/L (ref 3.5–5.1)
SODIUM: 139 mmol/L (ref 135–145)
SODIUM: 138 mmol/L (ref 135–145)

## 2015-10-31 NOTE — Progress Notes (Signed)
Patient Name: Preston Benjamin Date of Encounter: 10/31/2015  Hospital Problem List     Active Problems:   Respiratory distress   Old MI (myocardial infarction)   CAD in native artery   VF (ventricular fibrillation) (HCC)   Pulmonary hypertension (HCC)   Mitral regurgitation   Acute on chronic respiratory failure with hypoxia and hypercapnia (HCC)   Unilateral emphysema (HCC)   Acute pulmonary edema (HCC)   Encounter for nasogastric (NG) tube placement   Chronic respiratory failure with hypoxia (HCC)     Subjective   Sedated on vent. On lasix gtts. BUN and Creatinine rising today, however CXR today demonstrates effusions concerning for CHF. In wrist restraints due to agitation. WBC count rising as well.  Inpatient Medications    . amiodarone  150 mg Intravenous Once  Inpatient medication flow sheet reviewed  Vital Signs    There were no vitals filed for this visit. No intake or output data in the 24 hours ending 10/31/15 0951 There were no vitals filed for this visit.  Physical Exam    GEN: Intubated, sedated, in no distress  Neck: Supple, JVP elevated to 3 cm, carotid bruits, or masses. Cardiac: RRR, no rubs, or gallops. No clubbing, cyanosis, no edema.  Radials/DP/PT 2+ and equal bilaterally.  Respiratory:  Respirations  regular and unlabored, clear to auscultation bilaterally. GI: Soft, nontender, nondistended, BS + x 4. Neuro:  MAEW  Labs    CBC  Recent Labs  10/29/15 0500 10/31/15 0655  WBC  --  12.9*  HGB 9.5* 8.6*  HCT 30.0* 27.7*  MCV  --  92.3  PLT  --  147*   Basic Metabolic Panel  Recent Labs  10/29/15 0500 10/31/15 0655  NA 134* 139  K 4.0 4.2  CL 95* 99*  CO2 28 31  GLUCOSE 206* 227*  BUN 37* 51*  CREATININE 1.67* 1.79*  CALCIUM 8.7* 9.0   Liver Function Tests No results for input(s): AST, ALT, ALKPHOS, BILITOT, PROT, ALBUMIN in the last 72 hours. No results for input(s): LIPASE, AMYLASE in the last 72 hours. Cardiac Enzymes No  results for input(s): CKTOTAL, CKMB, CKMBINDEX, TROPONINI in the last 72 hours. BNP Invalid input(s): POCBNP D-Dimer No results for input(s): DDIMER in the last 72 hours. Hemoglobin A1C No results for input(s): HGBA1C in the last 72 hours. Fasting Lipid Panel No results for input(s): CHOL, HDL, LDLCALC, TRIG, CHOLHDL, LDLDIRECT in the last 72 hours. Thyroid Function Tests No results for input(s): TSH, T4TOTAL, T3FREE, THYROIDAB in the last 72 hours.  Invalid input(s): FREET3  Telemetry    Sinus rhythm in the 60's  ECG  None to review today  Radiology    Dg Chest Port 1 View  Result Date: 10/31/2015 CLINICAL DATA:  Respiratory failure.  COPD. EXAM: PORTABLE CHEST 1 VIEW COMPARISON:  10/29/2015 FINDINGS: Tracheostomy tube is present. Nasogastric tube extends into the abdomen. Heart size is within normal limits and stable. Increased densities at both lung bases are suggestive for pleural fluid and probably consolidation. Increased interstitial densities throughout both lungs suggestive for pulmonary edema. PICC line tip in the SVC region. IMPRESSION: Evidence for pulmonary edema and probable pleural effusions. Findings are concerning for CHF. Electronically Signed   By: Richarda Overlie M.D.   On: 10/31/2015 07:24   Dg Chest Port 1 View  Result Date: 10/29/2015 CLINICAL DATA:  Acute hypoxia chest x-ray of 10/27/2015 EXAM: PORTABLE CHEST 1 VIEW COMPARISON:  None. FINDINGS: The lungs appear slightly better aerated with  decreasing bibasilar volume loss. No pneumonia or effusion is seen. Tracheostomy, right central venous line, and NG tube remain. Heart size is stable. IMPRESSION: Slightly better aeration.  Decreased basilar atelectasis. Electronically Signed   By: Dwyane DeePaul  Barry M.D.   On: 10/29/2015 08:01   Dg Chest Port 1 View  Result Date: 10/27/2015 CLINICAL DATA:  Respiratory failure.  Pulmonary hypertension. EXAM: PORTABLE CHEST 1 VIEW COMPARISON:  10/23/2015 and 10/22/2015 FINDINGS: PICC, NG  tube and endotracheal tube appear in good position. Slightly decreased pulmonary edema and pleural effusions. IMPRESSION: Slight decrease in the pulmonary edema and pleural effusions. Electronically Signed   By: Francene BoyersJames  Maxwell M.D.   On: 10/27/2015 07:54   Dg Chest Port 1 View  Result Date: 10/23/2015 CLINICAL DATA:  Status post PICC and NG tube placement today. EXAM: PORTABLE CHEST 1 VIEW COMPARISON:  Single-view of the chest earlier today. FINDINGS: Right PICC is in place with the tip projecting in the lower superior vena cava. Endotracheal tube and NG tube are noted. The patient has extensive bilateral airspace disease which is worse on the right. There is likely a right pleural effusion. Cardiomegaly is noted. IMPRESSION: Tip of right PICC projects in the lower superior vena cava. Marked worsening in aeration since the study yesterday with extensive bilateral airspace disease and likely effusions, greater on the right, with an appearance most compatible with pulmonary edema. Electronically Signed   By: Drusilla Kannerhomas  Dalessio M.D.   On: 10/23/2015 15:47   Dg Chest Port 1 View  Addendum Date: 10/22/2015   ADDENDUM REPORT: 10/22/2015 05:40 ADDENDUM: An endotracheal tube is present with tip measuring about 4.5 cm above the carina. Visualization is limited due to motion artifact. Appears in satisfactory location. Electronically Signed   By: Burman NievesWilliam  Stevens M.D.   On: 10/22/2015 05:40   Result Date: 10/22/2015 CLINICAL DATA:  Respiratory failure. EXAM: PORTABLE CHEST 1 VIEW COMPARISON:  10/19/2015 FINDINGS: Cardiac enlargement. Atelectasis in the lung bases. Can't exclude consolidation in the left lung base behind the heart. Probable small pleural effusions. Infiltration seen previously in the right upper lung has resolved. No pneumothorax. Calcification of the aorta. Degenerative changes in the spine. IMPRESSION: Cardiac enlargement with atelectasis in the lung bases. Probable bilateral small pleural effusions.  Possible consolidation in the left lung base behind the heart. Electronically Signed: By: Burman NievesWilliam  Stevens M.D. On: 10/22/2015 01:44   Dg Chest Port 1 View  Result Date: 10/19/2015 CLINICAL DATA:  Respiratory failure. Pt unable to relate history at this time. EXAM: PORTABLE CHEST 1 VIEW COMPARISON:  None. FINDINGS: Normal cardiac silhouette. Band of atelectasis in the LEFT lower lobe. Diffuse airspace disease in the RIGHT upper lobe and RIGHT lower lobe. No pneumothorax. IMPRESSION: 1. Diffuse airspace disease in the RIGHT upper lobe and RIGHT lower lobe suggests asymmetric edema versus multifocal pneumonia. 2. LEFT lower lobe atelectasis. Electronically Signed   By: Genevive BiStewart  Edmunds M.D.   On: 10/19/2015 09:52   Dg Abd Portable 1v  Result Date: 10/23/2015 CLINICAL DATA:  PICC line and NG tube placement EXAM: PORTABLE ABDOMEN - 1 VIEW COMPARISON:  None. FINDINGS: There is normal small bowel gas pattern. There is hazy airspace disease in right lung. Asymmetric edema or infiltrate cannot be excluded. NG tube is noted coiled within stomach with tip in proximal stomach. Partially visualized right PICC line with tip in distal SVC. IMPRESSION: There is hazy airspace disease in right lung. Asymmetric edema or infiltrate cannot be excluded. NG tube is noted coiled within stomach with  tip in proximal stomach. Partially visualized right PICC line with tip in distal SVC. Electronically Signed   By: Natasha Mead M.D.   On: 10/23/2015 15:44   ECHO (10/27/2015):  LV EF: 40% -   45%  ------------------------------------------------------------------- Indications:      CHF - 428.0.  ------------------------------------------------------------------- History:   PMH:  V fib arrest. Respiratory distress. Old MI. CAD. Pulmonary hypertension.  ------------------------------------------------------------------- Study Conclusions  - Left ventricle: The cavity size was normal. Wall thickness was   increased in a  pattern of mild LVH. Systolic function was mildly   to moderately reduced. The estimated ejection fraction was in the   range of 40% to 45%. Inferior akinesis with enhanced   echogenicity, suggestive of scar. Doppler parameters are   consistent with pseudonormal left ventricular relaxation (grade 2   diastolic dysfunction). The E/A ratio is >2. The E/e&' ratio is   >20, suggesting markedly elevated LV filling pressure. - Mitral valve: Ischemic tethering of the posterior leaflet.   Moderate to severe eccentric mitral regurgitation directed   posteriorly. - Left atrium: The atrium was mildly dilated. - Right ventricle: The cavity size was normal. Systolic function   was low normal. - Right atrium: The atrium was mildly dilated. - Atrial septum: No defect or patent foramen ovale was identified. - Tricuspid valve: There was mild regurgitation. - Pulmonary arteries: PA peak pressure: 53 mm Hg (S). - Inferior vena cava: The vessel was dilated. The respirophasic   diameter changes were blunted (< 50%), consistent with elevated   central venous pressure. - Pericardium, extracardiac: There was no pericardial effusion.   There was a left pleural effusion.  Impressions:  - LVEF 40-45%, mild LVH, inferior akinesis and scar, grade 2 DD   with high LV filling pressure, ischemic tethering of the   posterior mitral leaflet with moderate to severe posteriorly   directed mitral regurgitation, mild biatrial enlargement, mild   TR, RVSP 53 mmHg, dilated IVC, left pleural effusion.   Assessment & Plan    1. VFIB ARREST:  No further arrhythmia.  Remains intubated - s/p trach. Continue IV amiodarone until he is able to take po. 2. ISCHEMIC CARDIOMYOPATHY:  Echo shows volume overload with high LV and RV filling pressures. LVEF 40-45% with moderate to severe eccentric MR and inferior scar. This is likely the source of his arrhythmia. On lasix gtts - BUN and Cr rising, however CXR shows pulmonary edema.  May need a nephrology consult 3. CHRONIC RESPIRATORY FAILURE: Suspect decompensated CHF is what is making it difficult to extubate him. Despite diuresis, there are persistent pulmonary infiltrates. Rising BUN/Cr today.  4. LEUKOCYTOSIS - There is new leukocytosis - ?development of consolidation or pneumonia. Consider antibiotic coverage.  Chrystie Nose, MD, [facility] Attending Cardiologist [facility]  Chrystie Nose, MD  10/31/2015, 9:51 AM

## 2015-11-01 LAB — BASIC METABOLIC PANEL
ANION GAP: 9 (ref 5–15)
BUN: 59 mg/dL — ABNORMAL HIGH (ref 6–20)
CHLORIDE: 98 mmol/L — AB (ref 101–111)
CO2: 32 mmol/L (ref 22–32)
Calcium: 9.2 mg/dL (ref 8.9–10.3)
Creatinine, Ser: 1.77 mg/dL — ABNORMAL HIGH (ref 0.61–1.24)
GFR calc non Af Amer: 37 mL/min — ABNORMAL LOW (ref >60)
GFR, EST AFRICAN AMERICAN: 42 mL/min — AB (ref >60)
Glucose, Bld: 203 mg/dL — ABNORMAL HIGH (ref 65–99)
POTASSIUM: 4.4 mmol/L (ref 3.5–5.1)
SODIUM: 139 mmol/L (ref 135–145)

## 2015-11-01 LAB — PROCALCITONIN: Procalcitonin: 0.11 ng/mL

## 2015-11-02 ENCOUNTER — Other Ambulatory Visit (HOSPITAL_COMMUNITY): Payer: Medicare Other

## 2015-11-02 LAB — BASIC METABOLIC PANEL
Anion gap: 10 (ref 5–15)
BUN: 63 mg/dL — AB (ref 6–20)
CHLORIDE: 99 mmol/L — AB (ref 101–111)
CO2: 35 mmol/L — ABNORMAL HIGH (ref 22–32)
Calcium: 9.6 mg/dL (ref 8.9–10.3)
Creatinine, Ser: 1.65 mg/dL — ABNORMAL HIGH (ref 0.61–1.24)
GFR calc Af Amer: 46 mL/min — ABNORMAL LOW (ref >60)
GFR calc non Af Amer: 40 mL/min — ABNORMAL LOW (ref >60)
GLUCOSE: 139 mg/dL — AB (ref 65–99)
POTASSIUM: 4 mmol/L (ref 3.5–5.1)
Sodium: 144 mmol/L (ref 135–145)

## 2015-11-02 LAB — CBC WITH DIFFERENTIAL/PLATELET
Basophils Absolute: 0 10*3/uL (ref 0.0–0.1)
Basophils Relative: 0 %
EOS PCT: 3 %
Eosinophils Absolute: 0.3 10*3/uL (ref 0.0–0.7)
HEMATOCRIT: 28.3 % — AB (ref 39.0–52.0)
HEMOGLOBIN: 8.7 g/dL — AB (ref 13.0–17.0)
LYMPHS ABS: 0.6 10*3/uL — AB (ref 0.7–4.0)
LYMPHS PCT: 5 %
MCH: 28.5 pg (ref 26.0–34.0)
MCHC: 30.7 g/dL (ref 30.0–36.0)
MCV: 92.8 fL (ref 78.0–100.0)
Monocytes Absolute: 1 10*3/uL (ref 0.1–1.0)
Monocytes Relative: 9 %
NEUTROS ABS: 9.1 10*3/uL — AB (ref 1.7–7.7)
NEUTROS PCT: 83 %
Platelets: 188 10*3/uL (ref 150–400)
RBC: 3.05 MIL/uL — AB (ref 4.22–5.81)
RDW: 15.2 % (ref 11.5–15.5)
WBC: 11.1 10*3/uL — ABNORMAL HIGH (ref 4.0–10.5)

## 2015-11-03 ENCOUNTER — Other Ambulatory Visit (HOSPITAL_COMMUNITY): Payer: Medicare Other

## 2015-11-03 DIAGNOSIS — Z430 Encounter for attention to tracheostomy: Secondary | ICD-10-CM | POA: Diagnosis not present

## 2015-11-03 DIAGNOSIS — J9622 Acute and chronic respiratory failure with hypercapnia: Secondary | ICD-10-CM | POA: Diagnosis not present

## 2015-11-03 DIAGNOSIS — J9621 Acute and chronic respiratory failure with hypoxia: Secondary | ICD-10-CM | POA: Diagnosis not present

## 2015-11-03 DIAGNOSIS — J810 Acute pulmonary edema: Secondary | ICD-10-CM | POA: Diagnosis not present

## 2015-11-03 LAB — BASIC METABOLIC PANEL
ANION GAP: 11 (ref 5–15)
BUN: 66 mg/dL — ABNORMAL HIGH (ref 6–20)
CALCIUM: 9.3 mg/dL (ref 8.9–10.3)
CO2: 34 mmol/L — ABNORMAL HIGH (ref 22–32)
Chloride: 98 mmol/L — ABNORMAL LOW (ref 101–111)
Creatinine, Ser: 1.58 mg/dL — ABNORMAL HIGH (ref 0.61–1.24)
GFR calc Af Amer: 49 mL/min — ABNORMAL LOW (ref >60)
GFR, EST NON AFRICAN AMERICAN: 42 mL/min — AB (ref >60)
GLUCOSE: 150 mg/dL — AB (ref 65–99)
Potassium: 4.2 mmol/L (ref 3.5–5.1)
Sodium: 143 mmol/L (ref 135–145)

## 2015-11-03 NOTE — Progress Notes (Signed)
Name: Preston Benjamin MRN: [id number] DOB: 05-22-1945    ADMISSION DATE:  10/17/2015 CONSULTATION DATE:  9/18  REFERRING MD :  [facility] ([facility])   CHIEF COMPLAINT:  Respiratory failure   BRIEF PATIENT DESCRIPTION: 70yo male with hx combined heart failure, severe COPD, HTN, DM with multiple recent admissions to [facility] for acute on chronic respiratory failure r/t AECOPD and decompensated heart failure.  Many of these admissions have required intubation.  On previous d/c 9/1 he was sent home with trilogy bipap.  He unfortunately continued to have increased somnolence and SOB and was admitted again 9/5, again requiring intubation.  During that admission he was found to have worsening pulmonary HTN with PASP as well as significant bilateral pleural effusions s/p thoracentesis (transudate).  He was diuresed and treated for AECOPD and again extubated but continued to required high flow O2 and was d/c to [facility] 9/15.  PCCM consulted to assist as he continues to require vapotherm 30LPM.    SIGNIFICANT EVENTS  9/20 - V fib arrest brief w/ patient reintubated   SUBJECTIVE:  Comfortable. Not in distress.   Had trache last week.  On going PST (vent) x 16 hrs now.    VITAL SIGNS: VSS.  O2 sats 100% on 60% Fio2.   PHYSICAL EXAMINATION: General:  Chronically ill appearing male, NAD on vent  Neuro:  Sedated RASS -3   HEENT:  Mm moist, ETT Cardiovascular:  s1s2 rrr Lungs:  resps even non labored on vent, diminished bases. Some crackles at bases.  Abdomen:  Round, soft, +bs  Musculoskeletal:  Warm and dry, scant BLE edema    Recent Labs Lab 11/01/15 0500 11/02/15 0658 11/03/15 0500  NA 139 144 143  K 4.4 4.0 4.2  CL 98* 99* 98*  CO2 32 35* 34*  BUN 59* 63* 66*  CREATININE 1.77* 1.65* 1.58*  GLUCOSE 203* 139* 150*    Recent Labs Lab 10/28/15 0500 10/29/15 0500 10/31/15 0655 11/02/15 0658  HGB 9.0* 9.5* 8.6* 8.7*  HCT 28.9* 30.0* 27.7* 28.3*  WBC 9.9  --  12.9* 11.1*  PLT  155  --  147* 188   Dg Chest Port 1 View  Result Date: 11/02/2015 CLINICAL DATA:  Cough EXAM: PORTABLE CHEST 1 VIEW COMPARISON:  10/31/2015; 10/29/2015; 10/22/2015 FINDINGS: Grossly unchanged cardiac silhouette and mediastinal contours. Atherosclerotic plaque within thoracic aorta. Stable position of support apparatus. No pneumothorax. Overall improved aeration of the lungs with increased conspicuity of the pulmonary vasculature. Residual bilateral infrahilar heterogeneous / consolidative opacities. No definite pleural effusion. No acute osseus abnormalities. IMPRESSION: 1.  Stable positioning of support apparatus.  No pneumothorax. 2. Improved aeration along suggests improving edema and/or atelectasis. No new focal airspace opacities. 3.  Aortic Atherosclerosis (ICD10-170.0) Electronically Signed   By: Simonne Come M.D.   On: 11/02/2015 11:46    ASSESSMENT / PLAN:  Acute on chronic hypercarbic and hypoxic respiratory failure - multifactorial in setting decompensated combined heart failure, pulmonary HTN, severe COPD likely c/b oversedation at home.  Has had multiple recent admissions requiring intubation.  Readmitted despite d/c on trilogy. Now intubated post VFib arrest.  S/P Tracheostomy  On 9/26.   PLAN -  Cont PST, do ATC when tolerated.  Keep O2 sats > 88%.  Cont diuresis. Cards following  BD's with Pulmicort BID and Duoneb QID (unless with HR issues; can switch duoneb to atrovent) Wean off sedation.  Awaiting trache culture > noted to have more trache secretions last 24 hrs. Not on abx  yet.   Pollie MeyerJ. Angelo A de Dios, MD 11/03/2015, 2:39 PM [facility] Pager [phone] After 3 pm or if no answer, call [phone]

## 2015-11-04 LAB — CBC
HEMATOCRIT: 30.5 % — AB (ref 39.0–52.0)
HEMOGLOBIN: 9.2 g/dL — AB (ref 13.0–17.0)
MCH: 27.9 pg (ref 26.0–34.0)
MCHC: 30.2 g/dL (ref 30.0–36.0)
MCV: 92.4 fL (ref 78.0–100.0)
Platelets: 239 10*3/uL (ref 150–400)
RBC: 3.3 MIL/uL — ABNORMAL LOW (ref 4.22–5.81)
RDW: 15 % (ref 11.5–15.5)
WBC: 13.4 10*3/uL — AB (ref 4.0–10.5)

## 2015-11-04 LAB — AEROBIC CULTURE W GRAM STAIN (SUPERFICIAL SPECIMEN)

## 2015-11-04 LAB — PROTIME-INR
INR: 1.39
Prothrombin Time: 17.2 seconds — ABNORMAL HIGH (ref 11.4–15.2)

## 2015-11-04 LAB — AEROBIC CULTURE  (SUPERFICIAL SPECIMEN): CULTURE: NORMAL

## 2015-11-04 NOTE — Consult Note (Signed)
Chief Complaint: Patient was seen in consultation today for percutaneous gastric tube placement at the request of Dr Carron Curie  Referring Physician(s): Dr Carron Curie  Supervising Physician: Gilmer Mor  Patient Status: Inpatient  History of Present Illness: Preston Benjamin is a 70 y.o. male   Pt with long history CHF; COPD Many admissions and intubations secondary hypoxia Difficulty weaning from vent Now with trach placed Deconditioning Need for long term care Protein calorie malnutrition/dysphagia Request for percutaneous gastric tube placement Imaging reviewed with dr Marilynne Drivers ATTEMPT at procedure Small bowel in way of stomach on film--- will attempt to displace bowel for safe window.   Past Medical History:  Diagnosis Date  . CAD (coronary artery disease)   . Chronic respiratory failure (HCC)   . Combined congestive systolic and diastolic heart failure (HCC)   . COPD (chronic obstructive pulmonary disease) (HCC)   . Hypertension   . IDDM (insulin dependent diabetes mellitus) (HCC)   . Pulmonary hypertension     Past Surgical History:  Procedure Laterality Date  . TRACHEOSTOMY TUBE PLACEMENT N/A 10/28/2015   Procedure: TRACHEOSTOMY;  Surgeon: Drema Halon, MD;  Location: [facility] OR;  Service: ENT;  Laterality: N/A;    Allergies: Review of patient's allergies indicates no known allergies.  Medications: Prior to Admission medications   Not on File     History reviewed. No pertinent family history.  Social History   Social History  . Marital status: Single    Spouse name: N/A  . Number of children: N/A  . Years of education: N/A   Social History Main Topics  . Smoking status: Former Games developer  . Smokeless tobacco: Never Used  . Alcohol use None  . Drug use: Unknown  . Sexual activity: Not Asked   Other Topics Concern  . None   Social History Narrative  . None     Review of Systems: A 12 point ROS discussed and pertinent positives  are indicated in the HPI above.  All other systems are negative.  Review of Systems  Constitutional:       Frail/ weak  Respiratory:       Vent;trach  Neurological:       No response    Vital Signs: There were no vitals taken for this visit.  Physical Exam  Cardiovascular: Normal rate and regular rhythm.   Pulmonary/Chest:  Trach/vent  Abdominal: Soft.  Skin: Skin is warm.  Psychiatric:  Discussed procedure with Dtr via phone---consent in chart  Nursing note and vitals reviewed.   Mallampati Score:  MD Evaluation Airway: Other (comments) Airway comments: trach/vent Heart: WNL Abdomen: WNL Chest/ Lungs: Other (comments) Chest/ lungs comments: trach/vent ASA  Classification: 3 Mallampati/Airway Score: Three  Imaging: Ct Abdomen Wo Contrast  Result Date: 11/03/2015 CLINICAL DATA:  69 year old hypertensive diabetic male. Assess for possible gastrostomy tube placement. Initial encounter. EXAM: CT ABDOMEN WITHOUT CONTRAST TECHNIQUE: Multidetector CT imaging of the abdomen was performed following the standard protocol without IV contrast. COMPARISON:  None. FINDINGS: Lower chest: Bilateral pleural effusions with basilar atelectasis. Left ventricle bulge with calcification consistent with injury from prior myocardial ischemia. Hepatobiliary: Taking into account limitation by non contrast imaging, no hepatic lesion. Gallbladder sludge. Pancreas: Taking into account limitation by non contrast imaging, no pancreatic lesion or inflammation. Spleen: Taking into account limitation by non contrast imaging, no splenic lesion or enlargement. Adrenals/Urinary Tract: Mild adrenal hyperplasia. No renal or ureteral obstructing stone. Taking into account limitation by non contrast imaging, no renal  mass. Stomach/Bowel: Nasogastric tube is in place tip at the level of the anterior margin of the gastric body/ antrum junction. This is immediately beneath the anterior wall of the abdomen. Superiorly  located appendix adjacent to the undersurface of the liver and gallbladder. No extra luminal bowel inflammatory process, free fluid or free air. Vascular/Lymphatic: Calcified plaque with ectatic slightly dilated aorta measuring up to 2.9 cm. Plaque with slightly dilated right iliac artery which may have a small chronic focal dissection. Other: Right lower abdominal wall inflammation. Question cellulitis. Musculoskeletal: No osseous destructive lesion. Degenerative changes lumbar spine with spinal stenosis most notable L2-3 level. IMPRESSION: Nasogastric tube is in place tip at the level of the anterior margin of the gastric body/ antrum junction. This is immediately beneath the anterior wall of the abdomen. Bilateral pleural effusions with basilar atelectasis. Left ventricle bulge with calcification consistent with injury from prior myocardial ischemia. Calcified plaque with ectatic slightly dilated aorta measuring up to 2.9 cm. Ectatic abdominal aorta at risk for aneurysm development. Recommend followup by ultrasound in 5 years. This recommendation follows ACR consensus guidelines: White Paper of the ACR Incidental Findings Committee II on Vascular Findings. J Am Coll Radiol 2013; 10:789-794. Plaque with slightly dilated right iliac artery which may have a small chronic focal dissection. Right lower abdominal wall inflammation.  Question cellulitis. Spinal stenosis L2-3. Electronically Signed   By: Lacy DuverneySteven  Olson M.D.   On: 11/03/2015 17:33   Dg Chest Port 1 View  Result Date: 11/02/2015 CLINICAL DATA:  Cough EXAM: PORTABLE CHEST 1 VIEW COMPARISON:  10/31/2015; 10/29/2015; 10/22/2015 FINDINGS: Grossly unchanged cardiac silhouette and mediastinal contours. Atherosclerotic plaque within thoracic aorta. Stable position of support apparatus. No pneumothorax. Overall improved aeration of the lungs with increased conspicuity of the pulmonary vasculature. Residual bilateral infrahilar heterogeneous / consolidative  opacities. No definite pleural effusion. No acute osseus abnormalities. IMPRESSION: 1.  Stable positioning of support apparatus.  No pneumothorax. 2. Improved aeration along suggests improving edema and/or atelectasis. No new focal airspace opacities. 3.  Aortic Atherosclerosis (ICD10-170.0) Electronically Signed   By: Simonne ComeJohn  Watts M.D.   On: 11/02/2015 11:46   Dg Chest Port 1 View  Result Date: 10/31/2015 CLINICAL DATA:  Respiratory failure.  COPD. EXAM: PORTABLE CHEST 1 VIEW COMPARISON:  10/29/2015 FINDINGS: Tracheostomy tube is present. Nasogastric tube extends into the abdomen. Heart size is within normal limits and stable. Increased densities at both lung bases are suggestive for pleural fluid and probably consolidation. Increased interstitial densities throughout both lungs suggestive for pulmonary edema. PICC line tip in the SVC region. IMPRESSION: Evidence for pulmonary edema and probable pleural effusions. Findings are concerning for CHF. Electronically Signed   By: Richarda OverlieAdam  Henn M.D.   On: 10/31/2015 07:24   Dg Chest Port 1 View  Result Date: 10/29/2015 CLINICAL DATA:  Acute hypoxia chest x-ray of 10/27/2015 EXAM: PORTABLE CHEST 1 VIEW COMPARISON:  None. FINDINGS: The lungs appear slightly better aerated with decreasing bibasilar volume loss. No pneumonia or effusion is seen. Tracheostomy, right central venous line, and NG tube remain. Heart size is stable. IMPRESSION: Slightly better aeration.  Decreased basilar atelectasis. Electronically Signed   By: Dwyane DeePaul  Barry M.D.   On: 10/29/2015 08:01   Dg Chest Port 1 View  Result Date: 10/27/2015 CLINICAL DATA:  Respiratory failure.  Pulmonary hypertension. EXAM: PORTABLE CHEST 1 VIEW COMPARISON:  10/23/2015 and 10/22/2015 FINDINGS: PICC, NG tube and endotracheal tube appear in good position. Slightly decreased pulmonary edema and pleural effusions. IMPRESSION: Slight decrease in  the pulmonary edema and pleural effusions. Electronically Signed   By: Francene Boyers M.D.   On: 10/27/2015 07:54   Dg Chest Port 1 View  Result Date: 10/23/2015 CLINICAL DATA:  Status post PICC and NG tube placement today. EXAM: PORTABLE CHEST 1 VIEW COMPARISON:  Single-view of the chest earlier today. FINDINGS: Right PICC is in place with the tip projecting in the lower superior vena cava. Endotracheal tube and NG tube are noted. The patient has extensive bilateral airspace disease which is worse on the right. There is likely a right pleural effusion. Cardiomegaly is noted. IMPRESSION: Tip of right PICC projects in the lower superior vena cava. Marked worsening in aeration since the study yesterday with extensive bilateral airspace disease and likely effusions, greater on the right, with an appearance most compatible with pulmonary edema. Electronically Signed   By: Drusilla Kanner M.D.   On: 10/23/2015 15:47   Dg Chest Port 1 View  Addendum Date: 10/22/2015   ADDENDUM REPORT: 10/22/2015 05:40 ADDENDUM: An endotracheal tube is present with tip measuring about 4.5 cm above the carina. Visualization is limited due to motion artifact. Appears in satisfactory location. Electronically Signed   By: Burman Nieves M.D.   On: 10/22/2015 05:40   Result Date: 10/22/2015 CLINICAL DATA:  Respiratory failure. EXAM: PORTABLE CHEST 1 VIEW COMPARISON:  10/19/2015 FINDINGS: Cardiac enlargement. Atelectasis in the lung bases. Can't exclude consolidation in the left lung base behind the heart. Probable small pleural effusions. Infiltration seen previously in the right upper lung has resolved. No pneumothorax. Calcification of the aorta. Degenerative changes in the spine. IMPRESSION: Cardiac enlargement with atelectasis in the lung bases. Probable bilateral small pleural effusions. Possible consolidation in the left lung base behind the heart. Electronically Signed: By: Burman Nieves M.D. On: 10/22/2015 01:44   Dg Chest Port 1 View  Result Date: 10/19/2015 CLINICAL DATA:  Respiratory  failure. Pt unable to relate history at this time. EXAM: PORTABLE CHEST 1 VIEW COMPARISON:  None. FINDINGS: Normal cardiac silhouette. Band of atelectasis in the LEFT lower lobe. Diffuse airspace disease in the RIGHT upper lobe and RIGHT lower lobe. No pneumothorax. IMPRESSION: 1. Diffuse airspace disease in the RIGHT upper lobe and RIGHT lower lobe suggests asymmetric edema versus multifocal pneumonia. 2. LEFT lower lobe atelectasis. Electronically Signed   By: Genevive Bi M.D.   On: 10/19/2015 09:52   Dg Abd Portable 1v  Result Date: 10/23/2015 CLINICAL DATA:  PICC line and NG tube placement EXAM: PORTABLE ABDOMEN - 1 VIEW COMPARISON:  None. FINDINGS: There is normal small bowel gas pattern. There is hazy airspace disease in right lung. Asymmetric edema or infiltrate cannot be excluded. NG tube is noted coiled within stomach with tip in proximal stomach. Partially visualized right PICC line with tip in distal SVC. IMPRESSION: There is hazy airspace disease in right lung. Asymmetric edema or infiltrate cannot be excluded. NG tube is noted coiled within stomach with tip in proximal stomach. Partially visualized right PICC line with tip in distal SVC. Electronically Signed   By: Natasha Mead M.D.   On: 10/23/2015 15:44    Labs:  CBC:  Recent Labs  10/24/15 1058 10/28/15 0500 10/29/15 0500 10/31/15 0655 11/02/15 0658  WBC 9.9 9.9  --  12.9* 11.1*  HGB 8.3* 9.0* 9.5* 8.6* 8.7*  HCT 26.3* 28.9* 30.0* 27.7* 28.3*  PLT 189 155  --  147* 188    COAGS:  Recent Labs  10/17/15 1647 10/19/15 1007 10/28/15 0500  INR  1.14 1.14 1.23    BMP:  Recent Labs  10/31/15 1345 11/01/15 0500 11/02/15 0658 11/03/15 0500  NA 138 139 144 143  K 4.4 4.4 4.0 4.2  CL 99* 98* 99* 98*  CO2 31 32 35* 34*  GLUCOSE 238* 203* 139* 150*  BUN 52* 59* 63* 66*  CALCIUM 9.0 9.2 9.6 9.3  CREATININE 1.81* 1.77* 1.65* 1.58*  GFRNONAA 36* 37* 40* 42*  GFRAA 41* 42* 46* 49*    LIVER FUNCTION  TESTS:  Recent Labs  10/17/15 1647 10/22/15 1852  BILITOT 0.7 0.9  AST 20 35  ALT 33 55  ALKPHOS 93 86  PROT 5.6* 5.3*  ALBUMIN 2.9* 3.0*    TUMOR MARKERS: No results for input(s): AFPTM, CEA, CA199, CHROMGRNA in the last 8760 hours.  Assessment and Plan:  Dysphagia; PCM Deconditioning' Need for long term care Scheduled for percutaneous gastric tube placement 10/4 in IR Risks and Benefits discussed with the patient including, but not limited to the need for a barium enema during the procedure, bleeding, infection, peritonitis, or damage to adjacent structures. All of the patient's questions were answered, patient is agreeable to proceed. Consent signed and in chart.   Thank you for this interesting consult.  I greatly enjoyed meeting Lejend Dalby and look forward to participating in their care.  A copy of this report was sent to the requesting provider on this date.  Electronically Signed: Ralene Muskrat A 11/04/2015, 10:28 AM   I spent a total of 40 Minutes    in face to face in clinical consultation, greater than 50% of which was counseling/coordinating care for perc gastric tube placement

## 2015-11-05 ENCOUNTER — Encounter (HOSPITAL_COMMUNITY): Payer: Self-pay | Admitting: Interventional Radiology

## 2015-11-05 ENCOUNTER — Other Ambulatory Visit (HOSPITAL_COMMUNITY): Payer: Medicare Other

## 2015-11-05 HISTORY — PX: IR GENERIC HISTORICAL: IMG1180011

## 2015-11-05 LAB — URINALYSIS, ROUTINE W REFLEX MICROSCOPIC
Bilirubin Urine: NEGATIVE
GLUCOSE, UA: NEGATIVE mg/dL
Hgb urine dipstick: NEGATIVE
KETONES UR: NEGATIVE mg/dL
LEUKOCYTES UA: NEGATIVE
Nitrite: NEGATIVE
PH: 7.5 (ref 5.0–8.0)
Protein, ur: NEGATIVE mg/dL
SPECIFIC GRAVITY, URINE: 1.027 (ref 1.005–1.030)

## 2015-11-05 MED ORDER — FENTANYL CITRATE (PF) 100 MCG/2ML IJ SOLN
INTRAMUSCULAR | Status: AC
  Filled 2015-11-05: qty 2

## 2015-11-05 MED ORDER — LIDOCAINE-EPINEPHRINE (PF) 1 %-1:200000 IJ SOLN
INTRAMUSCULAR | Status: AC
  Filled 2015-11-05: qty 30

## 2015-11-05 MED ORDER — CEFAZOLIN SODIUM-DEXTROSE 2-4 GM/100ML-% IV SOLN: 2.0000 g | Freq: Once | INTRAVENOUS | Status: DC

## 2015-11-05 MED ORDER — GLUCAGON HCL RDNA (DIAGNOSTIC) 1 MG IJ SOLR
INTRAMUSCULAR | Status: AC
  Filled 2015-11-05: qty 1

## 2015-11-05 MED ORDER — GLUCAGON HCL (RDNA) 1 MG IJ SOLR
INTRAMUSCULAR | Status: AC | PRN
  Administered 2015-11-05: 1 mg via INTRAVENOUS

## 2015-11-05 MED ORDER — MIDAZOLAM HCL 2 MG/2ML IJ SOLN
INTRAMUSCULAR | Status: AC
  Filled 2015-11-05: qty 2

## 2015-11-05 MED ORDER — MIDAZOLAM HCL 2 MG/2ML IJ SOLN
INTRAMUSCULAR | Status: AC | PRN
  Administered 2015-11-05: 1 mg via INTRAVENOUS

## 2015-11-05 MED ORDER — IOPAMIDOL (ISOVUE-300) INJECTION 61%
INTRAVENOUS | Status: AC
  Administered 2015-11-05: 20 mL
  Filled 2015-11-05: qty 50

## 2015-11-05 MED ORDER — FENTANYL CITRATE (PF) 100 MCG/2ML IJ SOLN
INTRAMUSCULAR | Status: AC | PRN
  Administered 2015-11-05: 50 ug via INTRAVENOUS

## 2015-11-05 NOTE — Sedation Documentation (Signed)
Patient is resting comfortably. 

## 2015-11-05 NOTE — Procedures (Signed)
Successful fluoroscopic guided insertion of gastrostomy tube.   The gastrostomy tube may be used immediately for medications.   Tube feeds may be initiated in 24 hours as per the primary team.   EBL: None No immediate post procedural complications.   Jay Cayci Mcnabb, MD Pager #: [phone]   

## 2015-11-06 NOTE — Progress Notes (Signed)
Patient Name: Preston Benjamin Date of Encounter: 11/06/2015   SUBJECTIVE  No chest pain or sob. On trach vent.m daughter at bedside.   CURRENT MEDS . amiodarone  150 mg Intravenous Once  .  ceFAZolin (ANCEF) IV  2 g Intravenous Once    OBJECTIVE  Vitals:   11/05/15 1201 11/05/15 1214 11/05/15 1216 11/05/15 1224  BP: 120/60 133/66 125/61 118/68  Pulse: 74 76 74 73  Resp: 20 (!) 30 (!) 30 (!) 27  SpO2: 95% 94% 91% (!) 88%   No intake or output data in the 24 hours ending 11/06/15 1243 There were no vitals filed for this visit.  PHYSICAL EXAM  General: Pleasant, thin frail  NAD. Neuro:  Moves all extremities spontaneously. Psych: sleepy HEENT:  Trach Vent  Neck: Supple without bruits or JVD. Lungs:  Resp regular and unlabored, CTA. Heart: RRR no s3, s4, or murmurs. Abdomen: Soft BS + x 4.  Extremities: No clubbing, cyanosis or edema. DP/PT/Radials 2+ and equal bilaterally.  Accessory Clinical Findings  CBC  Recent Labs  11/04/15 1310  WBC 13.4*  HGB 9.2*  HCT 30.5*  MCV 92.4  PLT 239   Basic Metabolic Panel No results for input(s): NA, K, CL, CO2, GLUCOSE, BUN, CREATININE, CALCIUM, MG, PHOS in the last 72 hours. Liver Function Tests No results for input(s): AST, ALT, ALKPHOS, BILITOT, PROT, ALBUMIN in the last 72 hours. No results for input(s): LIPASE, AMYLASE in the last 72 hours. Cardiac Enzymes No results for input(s): CKTOTAL, CKMB, CKMBINDEX, TROPONINI in the last 72 hours. BNP Invalid input(s): POCBNP D-Dimer No results for input(s): DDIMER in the last 72 hours. Hemoglobin A1C No results for input(s): HGBA1C in the last 72 hours. Fasting Lipid Panel No results for input(s): CHOL, HDL, LDLCALC, TRIG, CHOLHDL, LDLDIRECT in the last 72 hours. Thyroid Function Tests No results for input(s): TSH, T4TOTAL, T3FREE, THYROIDAB in the last 72 hours.  Invalid input(s): FREET3  TELE  Sinus rhythm at rate of 70s  Radiology/Studies  Ct Abdomen Wo  Contrast  Result Date: 11/03/2015 CLINICAL DATA:  70 year old hypertensive diabetic male. Assess for possible gastrostomy tube placement. Initial encounter. EXAM: CT ABDOMEN WITHOUT CONTRAST TECHNIQUE: Multidetector CT imaging of the abdomen was performed following the standard protocol without IV contrast. COMPARISON:  None. FINDINGS: Lower chest: Bilateral pleural effusions with basilar atelectasis. Left ventricle bulge with calcification consistent with injury from prior myocardial ischemia. Hepatobiliary: Taking into account limitation by non contrast imaging, no hepatic lesion. Gallbladder sludge. Pancreas: Taking into account limitation by non contrast imaging, no pancreatic lesion or inflammation. Spleen: Taking into account limitation by non contrast imaging, no splenic lesion or enlargement. Adrenals/Urinary Tract: Mild adrenal hyperplasia. No renal or ureteral obstructing stone. Taking into account limitation by non contrast imaging, no renal mass. Stomach/Bowel: Nasogastric tube is in place tip at the level of the anterior margin of the gastric body/ antrum junction. This is immediately beneath the anterior wall of the abdomen. Superiorly located appendix adjacent to the undersurface of the liver and gallbladder. No extra luminal bowel inflammatory process, free fluid or free air. Vascular/Lymphatic: Calcified plaque with ectatic slightly dilated aorta measuring up to 2.9 cm. Plaque with slightly dilated right iliac artery which may have a small chronic focal dissection. Other: Right lower abdominal wall inflammation. Question cellulitis. Musculoskeletal: No osseous destructive lesion. Degenerative changes lumbar spine with spinal stenosis most notable L2-3 level. IMPRESSION: Nasogastric tube is in place tip at the level of the anterior margin of the gastric  body/ antrum junction. This is immediately beneath the anterior wall of the abdomen. Bilateral pleural effusions with basilar atelectasis. Left  ventricle bulge with calcification consistent with injury from prior myocardial ischemia. Calcified plaque with ectatic slightly dilated aorta measuring up to 2.9 cm. Ectatic abdominal aorta at risk for aneurysm development. Recommend followup by ultrasound in 5 years. This recommendation follows ACR consensus guidelines: White Paper of the ACR Incidental Findings Committee II on Vascular Findings. J Am Coll Radiol 2013; 10:789-794. Plaque with slightly dilated right iliac artery which may have a small chronic focal dissection. Right lower abdominal wall inflammation.  Question cellulitis. Spinal stenosis L2-3. Electronically Signed   By: Lacy Duverney M.D.   On: 11/03/2015 17:33   Ir Gastrostomy Tube Mod Sed  Result Date: 11/05/2015 INDICATION: History of dysphagia, now with request for percutaneous gastrostomy tube placement for enteric nutrition supplementation. EXAM: PULL TROUGH GASTROSTOMY TUBE PLACEMENT COMPARISON:  CT abdomen pelvis - 11/03/2015 MEDICATIONS: Ancef 2 gm IV; Antibiotics were administered within 1 hour of the procedure. Glucagon 1 mg IV CONTRAST:  300 mL of Isovue 300 administered into the gastric lumen. ANESTHESIA/SEDATION: Moderate (conscious) sedation was employed during this procedure. A total of Versed 1 mg and Fentanyl 50 mcg was administered intravenously. Moderate Sedation Time: 10 minutes. The patient's level of consciousness and vital signs were monitored continuously by radiology nursing throughout the procedure under my direct supervision. FLUOROSCOPY TIME:  2 minutes 48 seconds (17 mGy) COMPLICATIONS: None immediate. PROCEDURE: Informed written consent was obtained from the patient following explanation of the procedure, risks, benefits and alternatives. A time out was performed prior to the initiation of the procedure. Ultrasound scanning was performed to demarcate the edge of the left lobe of the liver. Maximal barrier sterile technique utilized including caps, mask, sterile  gowns, sterile gloves, large sterile drape, hand hygiene and Betadine prep. The left upper quadrant was sterilely prepped and draped. An oral gastric catheter was inserted into the stomach under fluoroscopy. The existing nasogastric feeding tube was removed. The left costal margin and air opacified transverse colon were identified and avoided. Air was injected into the stomach for insufflation and visualization under fluoroscopy. Under sterile conditions a 17 gauge trocar needle was utilized to access the stomach percutaneously beneath the left subcostal margin after the overlying soft tissues were anesthetized with 1% Lidocaine with epinephrine. Needle position was confirmed within the stomach with aspiration of air and injection of small amount of contrast. A single T tack was deployed for gastropexy. Over an Amplatz guide wire, a 9-French sheath was inserted into the stomach. A snare device was utilized to capture the oral gastric catheter. The snare device was pulled retrograde from the stomach up the esophagus and out the oropharynx. The 20-French pull-through gastrostomy was connected to the snare device and pulled antegrade through the oropharynx down the esophagus into the stomach and then through the percutaneous tract external to the patient. The gastrostomy was assembled externally. Contrast injection confirms position in the stomach. Several spot radiographic images were obtained in various obliquities for documentation. The patient tolerated procedure well without immediate post procedural complication. FINDINGS: After successful fluoroscopic guided placement, the gastrostomy tube is appropriately positioned with internal disc against the ventral aspect of the gastric lumen. IMPRESSION: Successful fluoroscopic insertion of a 20-French pull-through gastrostomy tube. The gastrostomy may be used immediately for medication administration and in 24 hrs for the initiation of feeds. Electronically Signed   By:  Simonne Come M.D.   On: 11/05/2015 15:30  Dg Chest Port 1 View  Result Date: 11/02/2015 CLINICAL DATA:  Cough EXAM: PORTABLE CHEST 1 VIEW COMPARISON:  10/31/2015; 10/29/2015; 10/22/2015 FINDINGS: Grossly unchanged cardiac silhouette and mediastinal contours. Atherosclerotic plaque within thoracic aorta. Stable position of support apparatus. No pneumothorax. Overall improved aeration of the lungs with increased conspicuity of the pulmonary vasculature. Residual bilateral infrahilar heterogeneous / consolidative opacities. No definite pleural effusion. No acute osseus abnormalities. IMPRESSION: 1.  Stable positioning of support apparatus.  No pneumothorax. 2. Improved aeration along suggests improving edema and/or atelectasis. No new focal airspace opacities. 3.  Aortic Atherosclerosis (ICD10-170.0) Electronically Signed   By: Simonne ComeJohn  Watts M.D.   On: 11/02/2015 11:46   Dg Chest Port 1 View  Result Date: 10/31/2015 CLINICAL DATA:  Respiratory failure.  COPD. EXAM: PORTABLE CHEST 1 VIEW COMPARISON:  10/29/2015 FINDINGS: Tracheostomy tube is present. Nasogastric tube extends into the abdomen. Heart size is within normal limits and stable. Increased densities at both lung bases are suggestive for pleural fluid and probably consolidation. Increased interstitial densities throughout both lungs suggestive for pulmonary edema. PICC line tip in the SVC region. IMPRESSION: Evidence for pulmonary edema and probable pleural effusions. Findings are concerning for CHF. Electronically Signed   By: Richarda OverlieAdam  Henn M.D.   On: 10/31/2015 07:24   Dg Chest Port 1 View  Result Date: 10/29/2015 CLINICAL DATA:  Acute hypoxia chest x-ray of 10/27/2015 EXAM: PORTABLE CHEST 1 VIEW COMPARISON:  None. FINDINGS: The lungs appear slightly better aerated with decreasing bibasilar volume loss. No pneumonia or effusion is seen. Tracheostomy, right central venous line, and NG tube remain. Heart size is stable. IMPRESSION: Slightly better  aeration.  Decreased basilar atelectasis. Electronically Signed   By: Dwyane DeePaul  Barry M.D.   On: 10/29/2015 08:01   Dg Chest Port 1 View  Result Date: 10/27/2015 CLINICAL DATA:  Respiratory failure.  Pulmonary hypertension. EXAM: PORTABLE CHEST 1 VIEW COMPARISON:  10/23/2015 and 10/22/2015 FINDINGS: PICC, NG tube and endotracheal tube appear in good position. Slightly decreased pulmonary edema and pleural effusions. IMPRESSION: Slight decrease in the pulmonary edema and pleural effusions. Electronically Signed   By: Francene BoyersJames  Maxwell M.D.   On: 10/27/2015 07:54   Dg Chest Port 1 View  Result Date: 10/23/2015 CLINICAL DATA:  Status post PICC and NG tube placement today. EXAM: PORTABLE CHEST 1 VIEW COMPARISON:  Single-view of the chest earlier today. FINDINGS: Right PICC is in place with the tip projecting in the lower superior vena cava. Endotracheal tube and NG tube are noted. The patient has extensive bilateral airspace disease which is worse on the right. There is likely a right pleural effusion. Cardiomegaly is noted. IMPRESSION: Tip of right PICC projects in the lower superior vena cava. Marked worsening in aeration since the study yesterday with extensive bilateral airspace disease and likely effusions, greater on the right, with an appearance most compatible with pulmonary edema. Electronically Signed   By: Drusilla Kannerhomas  Dalessio M.D.   On: 10/23/2015 15:47   Dg Chest Port 1 View  Addendum Date: 10/22/2015   ADDENDUM REPORT: 10/22/2015 05:40 ADDENDUM: An endotracheal tube is present with tip measuring about 4.5 cm above the carina. Visualization is limited due to motion artifact. Appears in satisfactory location. Electronically Signed   By: Burman NievesWilliam  Stevens M.D.   On: 10/22/2015 05:40   Result Date: 10/22/2015 CLINICAL DATA:  Respiratory failure. EXAM: PORTABLE CHEST 1 VIEW COMPARISON:  10/19/2015 FINDINGS: Cardiac enlargement. Atelectasis in the lung bases. Can't exclude consolidation in the left lung base  behind  the heart. Probable small pleural effusions. Infiltration seen previously in the right upper lung has resolved. No pneumothorax. Calcification of the aorta. Degenerative changes in the spine. IMPRESSION: Cardiac enlargement with atelectasis in the lung bases. Probable bilateral small pleural effusions. Possible consolidation in the left lung base behind the heart. Electronically Signed: By: Burman Nieves M.D. On: 10/22/2015 01:44   Dg Chest Port 1 View  Result Date: 10/19/2015 CLINICAL DATA:  Respiratory failure. Pt unable to relate history at this time. EXAM: PORTABLE CHEST 1 VIEW COMPARISON:  None. FINDINGS: Normal cardiac silhouette. Band of atelectasis in the LEFT lower lobe. Diffuse airspace disease in the RIGHT upper lobe and RIGHT lower lobe. No pneumothorax. IMPRESSION: 1. Diffuse airspace disease in the RIGHT upper lobe and RIGHT lower lobe suggests asymmetric edema versus multifocal pneumonia. 2. LEFT lower lobe atelectasis. Electronically Signed   By: Genevive Bi M.D.   On: 10/19/2015 09:52   Dg Abd Portable 1v  Result Date: 10/23/2015 CLINICAL DATA:  PICC line and NG tube placement EXAM: PORTABLE ABDOMEN - 1 VIEW COMPARISON:  None. FINDINGS: There is normal small bowel gas pattern. There is hazy airspace disease in right lung. Asymmetric edema or infiltrate cannot be excluded. NG tube is noted coiled within stomach with tip in proximal stomach. Partially visualized right PICC line with tip in distal SVC. IMPRESSION: There is hazy airspace disease in right lung. Asymmetric edema or infiltrate cannot be excluded. NG tube is noted coiled within stomach with tip in proximal stomach. Partially visualized right PICC line with tip in distal SVC. Electronically Signed   By: Natasha Mead M.D.   On: 10/23/2015 15:44    ASSESSMENT AND PLAN Active Problems:   Respiratory distress   Old MI (myocardial infarction)   CAD in native artery   VF (ventricular fibrillation) (HCC)   Pulmonary  hypertension   Mitral regurgitation   Acute on chronic respiratory failure with hypoxia and hypercapnia (HCC)   Unilateral emphysema (HCC)   Acute pulmonary edema (HCC)   Encounter for nasogastric (NG) tube placement   Chronic respiratory failure with hypoxia (HCC)   Tracheostomy care (HCC)    Plan: S/p Successful fluoroscopic guided insertion of gastrostomy tube yesterday. Started on Po amiodarone 200mg  QD today. Spoke with Dr. Sharyon Medicus. Plan to switch to 100mg  in 3-4 days. Maintaining sinus rhythm. No further arrhythmia.  On Trach vent. Breathing and volume status stable. Will sign off. Call with questions.   Lorelei Pont PA-C Pager [phone]   Patient seen and examined. Agree with assessment and plan. Pt was started on iv amiodarone on 9/20 and received iv therapy for 2 weeks. Should have received ~10.5 gm so far for adequate loading. OK to switch to 200 mg bid for 2 weeks, then 200 mg daily if no recurrent arrhythmia. Ideally would not switch to 100 mg if lungs can tolerate.   Lennette Bihari, MD, [facility] 11/06/2015 2:11 PM

## 2015-11-07 ENCOUNTER — Other Ambulatory Visit (HOSPITAL_COMMUNITY): Payer: Medicare Other

## 2015-11-07 LAB — BASIC METABOLIC PANEL
Anion gap: 12 (ref 5–15)
BUN: 59 mg/dL — AB (ref 6–20)
CHLORIDE: 103 mmol/L (ref 101–111)
CO2: 30 mmol/L (ref 22–32)
CREATININE: 1.85 mg/dL — AB (ref 0.61–1.24)
Calcium: 8.7 mg/dL — ABNORMAL LOW (ref 8.9–10.3)
GFR calc Af Amer: 40 mL/min — ABNORMAL LOW (ref >60)
GFR calc non Af Amer: 35 mL/min — ABNORMAL LOW (ref >60)
GLUCOSE: 136 mg/dL — AB (ref 65–99)
POTASSIUM: 3.8 mmol/L (ref 3.5–5.1)
SODIUM: 145 mmol/L (ref 135–145)

## 2015-11-07 LAB — CBC
HCT: 27.1 % — ABNORMAL LOW (ref 39.0–52.0)
HEMOGLOBIN: 8.1 g/dL — AB (ref 13.0–17.0)
MCH: 27.5 pg (ref 26.0–34.0)
MCHC: 29.9 g/dL — AB (ref 30.0–36.0)
MCV: 91.9 fL (ref 78.0–100.0)
Platelets: 257 10*3/uL (ref 150–400)
RBC: 2.95 MIL/uL — ABNORMAL LOW (ref 4.22–5.81)
RDW: 15.3 % (ref 11.5–15.5)
WBC: 8.2 10*3/uL (ref 4.0–10.5)

## 2015-11-08 LAB — BASIC METABOLIC PANEL
ANION GAP: 10 (ref 5–15)
BUN: 56 mg/dL — ABNORMAL HIGH (ref 6–20)
CHLORIDE: 106 mmol/L (ref 101–111)
CO2: 29 mmol/L (ref 22–32)
Calcium: 8.4 mg/dL — ABNORMAL LOW (ref 8.9–10.3)
Creatinine, Ser: 1.72 mg/dL — ABNORMAL HIGH (ref 0.61–1.24)
GFR calc Af Amer: 44 mL/min — ABNORMAL LOW (ref >60)
GFR, EST NON AFRICAN AMERICAN: 38 mL/min — AB (ref >60)
GLUCOSE: 163 mg/dL — AB (ref 65–99)
POTASSIUM: 4 mmol/L (ref 3.5–5.1)
Sodium: 145 mmol/L (ref 135–145)

## 2015-11-10 DIAGNOSIS — J9621 Acute and chronic respiratory failure with hypoxia: Secondary | ICD-10-CM | POA: Diagnosis not present

## 2015-11-10 DIAGNOSIS — J9622 Acute and chronic respiratory failure with hypercapnia: Secondary | ICD-10-CM | POA: Diagnosis not present

## 2015-11-10 NOTE — Progress Notes (Signed)
   Name: Preston Benjamin MRN: [id number] DOB: 03/19/1945    ADMISSION DATE:  10/17/2015 CONSULTATION DATE:  9/18  REFERRING MD :  [facility] ([facility])   CHIEF COMPLAINT:  Respiratory failure   BRIEF PATIENT DESCRIPTION: 70yo male with hx combined heart failure, severe COPD, HTN, DM with [facility] acute on chronic respiratory failure r/t AECOPD and decompensated heart failure.  Many of these admissions have required intubation.  On previous d/c 9/1 he was sent home with trilogy bipap.  He unfortunately continued to have increased somnolence and SOB and was admitted again 9/5, again requiring intubation.  During that admission he was found to have worsening pulmonary HTN with PASP 68mmHg as well as significant bilateral pleural effusions s/p thoracentesis (transudate).  He was diuresed and treated for AECOPD and again extubated but continued to required high flow O2 and was d[facility].  PCCM consulted to assist as he continues to require vapotherm 30LPM.    SIGNIFICANT EVENTS  9/20 - V fib arrest brief w/ patient reintubated  Trach culture negative  SUBJECTIVE:  Comfortable. On PSV 12/5 now. Was on ATC last week, unclear what caused this setback.   VITAL SIGNS: 97.82F, P64, R20, BP138/71, 90%  PHYSICAL EXAMINATION: General:  Chronically ill appearing male, NAD on vent  Neuro:  Alert follows commands HEENT:  Mm moist, ETT Cardiovascular:  s1s2 rrr Lungs:  resps even non labored on vent, diminished bases. Abdomen:  Round, soft, +bs  Musculoskeletal:  Warm and dry, scant BLE edema    Recent Labs Lab 11/07/15 0635 11/08/15 0505  NA 145 145  K 3.8 4.0  CL 103 106  CO2 30 29  BUN 59* 56*  CREATININE 1.85* 1.72*  GLUCOSE 136* 163*    Recent Labs Lab 11/04/15 1310 11/07/15 0635  HGB 9.2* 8.1*  HCT 30.5* 27.1*  WBC 13.4* 8.2  PLT 239 257   No results found.  ASSESSMENT / PLAN:  Acute on chronic hypercarbic and hypoxic respiratory failure -  multifactorial in setting decompensated combined heart failure, pulmonary HTN, severe COPD likely c/b oversedation at home.  Has had multiple recent admissions requiring intubation.  Readmitted despite d/c on trilogy. Now intubated post VFib arrest.  S/P Tracheostomy  On 9/26.   PLAN -  Cont PST, do ATC when tolerated (Plan is PSV 12 hrs today, 16 hrs tomorrow, the ATC) Keep O2 sats > 88%.  BD's with Pulmicort BID and duoneb Diurese as tolerated with some edema remaining on CXR 10/6 and renal function improving. Mobilize, OOB to chair as tolerated.  Joneen RoachPaul Hoffman, AGACNP-BC [facility] Pager [phone] or [phone]  11/10/2015 12:08 PM   Attending note: I have seen and examined the patient with nurse practitioner/resident and agree with the note. History, labs and imaging reviewed.  70 Y/O with severe COPD, HTN, DM admitted woth AECOPD, CHF s/p trach He is progressing on pressure support weans. Vitals are stable. Slightly delirious otherwise in no distress. Clear lungs on auscultation.   Preston GreathousePraveen Aydan Phoenix MD [facility] Pager [phone] If no answer or after 3pm call: [phone] 11/10/2015, 12:29 PM

## 2015-11-11 LAB — CBC
HCT: 27.8 % — ABNORMAL LOW (ref 39.0–52.0)
Hemoglobin: 8.2 g/dL — ABNORMAL LOW (ref 13.0–17.0)
MCH: 27.4 pg (ref 26.0–34.0)
MCHC: 29.5 g/dL — AB (ref 30.0–36.0)
MCV: 93 fL (ref 78.0–100.0)
Platelets: 273 10*3/uL (ref 150–400)
RBC: 2.99 MIL/uL — ABNORMAL LOW (ref 4.22–5.81)
RDW: 16.1 % — AB (ref 11.5–15.5)
WBC: 10 10*3/uL (ref 4.0–10.5)

## 2015-11-11 LAB — BASIC METABOLIC PANEL
Anion gap: 11 (ref 5–15)
BUN: 48 mg/dL — AB (ref 6–20)
CALCIUM: 8.6 mg/dL — AB (ref 8.9–10.3)
CO2: 24 mmol/L (ref 22–32)
CREATININE: 1.61 mg/dL — AB (ref 0.61–1.24)
Chloride: 108 mmol/L (ref 101–111)
GFR calc Af Amer: 48 mL/min — ABNORMAL LOW (ref >60)
GFR calc non Af Amer: 41 mL/min — ABNORMAL LOW (ref >60)
GLUCOSE: 180 mg/dL — AB (ref 65–99)
Potassium: 4.7 mmol/L (ref 3.5–5.1)
Sodium: 143 mmol/L (ref 135–145)

## 2015-11-14 ENCOUNTER — Other Ambulatory Visit (HOSPITAL_COMMUNITY): Payer: Medicare Other

## 2015-11-14 LAB — BASIC METABOLIC PANEL
ANION GAP: 8 (ref 5–15)
BUN: 43 mg/dL — AB (ref 6–20)
CALCIUM: 8.7 mg/dL — AB (ref 8.9–10.3)
CO2: 28 mmol/L (ref 22–32)
Chloride: 105 mmol/L (ref 101–111)
Creatinine, Ser: 1.52 mg/dL — ABNORMAL HIGH (ref 0.61–1.24)
GFR calc Af Amer: 51 mL/min — ABNORMAL LOW (ref >60)
GFR, EST NON AFRICAN AMERICAN: 44 mL/min — AB (ref >60)
Glucose, Bld: 140 mg/dL — ABNORMAL HIGH (ref 65–99)
POTASSIUM: 4.5 mmol/L (ref 3.5–5.1)
SODIUM: 141 mmol/L (ref 135–145)

## 2015-11-14 LAB — CBC
HCT: 23.8 % — ABNORMAL LOW (ref 39.0–52.0)
HEMOGLOBIN: 7.3 g/dL — AB (ref 13.0–17.0)
MCH: 28.1 pg (ref 26.0–34.0)
MCHC: 30.7 g/dL (ref 30.0–36.0)
MCV: 91.5 fL (ref 78.0–100.0)
Platelets: 237 10*3/uL (ref 150–400)
RBC: 2.6 MIL/uL — AB (ref 4.22–5.81)
RDW: 17 % — ABNORMAL HIGH (ref 11.5–15.5)
WBC: 9.6 10*3/uL (ref 4.0–10.5)

## 2015-11-14 LAB — BRAIN NATRIURETIC PEPTIDE: B NATRIURETIC PEPTIDE 5: 196.4 pg/mL — AB (ref 0.0–100.0)

## 2015-11-18 ENCOUNTER — Other Ambulatory Visit (HOSPITAL_COMMUNITY): Payer: Medicare Other

## 2015-11-18 DIAGNOSIS — J9621 Acute and chronic respiratory failure with hypoxia: Secondary | ICD-10-CM | POA: Diagnosis not present

## 2015-11-18 DIAGNOSIS — J9622 Acute and chronic respiratory failure with hypercapnia: Secondary | ICD-10-CM | POA: Diagnosis not present

## 2015-11-18 LAB — CBC
HCT: 25.9 % — ABNORMAL LOW (ref 39.0–52.0)
Hemoglobin: 8 g/dL — ABNORMAL LOW (ref 13.0–17.0)
MCH: 27.8 pg (ref 26.0–34.0)
MCHC: 30.9 g/dL (ref 30.0–36.0)
MCV: 89.9 fL (ref 78.0–100.0)
PLATELETS: 241 10*3/uL (ref 150–400)
RBC: 2.88 MIL/uL — AB (ref 4.22–5.81)
RDW: 17.5 % — ABNORMAL HIGH (ref 11.5–15.5)
WBC: 12.1 10*3/uL — ABNORMAL HIGH (ref 4.0–10.5)

## 2015-11-18 LAB — BASIC METABOLIC PANEL
Anion gap: 9 (ref 5–15)
BUN: 37 mg/dL — AB (ref 6–20)
CO2: 28 mmol/L (ref 22–32)
Calcium: 8.8 mg/dL — ABNORMAL LOW (ref 8.9–10.3)
Chloride: 100 mmol/L — ABNORMAL LOW (ref 101–111)
Creatinine, Ser: 1.28 mg/dL — ABNORMAL HIGH (ref 0.61–1.24)
GFR calc Af Amer: >60 mL/min (ref >60)
GFR, EST NON AFRICAN AMERICAN: 54 mL/min — AB (ref >60)
GLUCOSE: 139 mg/dL — AB (ref 65–99)
POTASSIUM: 4.3 mmol/L (ref 3.5–5.1)
Sodium: 137 mmol/L (ref 135–145)

## 2015-11-18 NOTE — Progress Notes (Signed)
PCCM PROGRESS NOTE  Admission date: 10/17/2015 Consult date: 10/20/2015 Referring provider: Dr. Sharyon MedicusHijazi  CC: short of breath  Subjective: Wants legs put down on his chair.  Vital signs: HR 68, BP 132/64, SpO2 92%, T 98.1  General: sitting in chair Neuro: alert, follows commands HEENT: trach site clean Cardiac: regular, 2/6 murmur Chest: no wheeze, decreased BS Abd: soft, non tender Ext: no edema Skin: no rashes  CMP Latest Ref Rng & Units 11/18/2015 11/14/2015 11/11/2015  Glucose 65 - 99 mg/dL 161(W139(H) 960(A140(H) 540(J180(H)  BUN 6 - 20 mg/dL 81(X37(H) 91(Y43(H) 78(G48(H)  Creatinine 0.61 - 1.24 mg/dL 9.56(O1.28(H) 1.30(Q1.52(H) 6.57(Q1.61(H)  Sodium 135 - 145 mmol/L 137 141 143  Potassium 3.5 - 5.1 mmol/L 4.3 4.5 4.7  Chloride 101 - 111 mmol/L 100(L) 105 108  CO2 22 - 32 mmol/L 28 28 24   Calcium 8.9 - 10.3 mg/dL 4.6(N8.8(L) 6.2(X8.7(L) 5.2(W8.6(L)  Total Protein 6.5 - 8.1 g/dL - - -  Total Bilirubin 0.3 - 1.2 mg/dL - - -  Alkaline Phos 38 - 126 U/L - - -  AST 15 - 41 U/L - - -  ALT 17 - 63 U/L - - -    CBC Latest Ref Rng & Units 11/18/2015 11/14/2015 11/11/2015  WBC 4.0 - 10.5 K/uL 12.1(H) 9.6 10.0  Hemoglobin 13.0 - 17.0 g/dL 8.0(L) 7.3(L) 8.2(L)  Hematocrit 39.0 - 52.0 % 25.9(L) 23.8(L) 27.8(L)  Platelets 150 - 400 K/uL 241 237 273    Dg Chest Port 1 View  Result Date: 11/18/2015 CLINICAL DATA:  Respiratory failure EXAM: PORTABLE CHEST 1 VIEW COMPARISON:  11/14/2015 FINDINGS: Cardiac shadow is stable. Tracheostomy tube is again noted in satisfactory position. Vascular congestion is again identified. No significant interstitial edema is noted although the bilateral small pleural effusions are seen. No new focal infiltrate is noted. Persistent left retrocardiac density is seen consistent with atelectasis. IMPRESSION: Stable CHF and left lower lobe atelectasis. Electronically Signed   By: Alcide CleverMark  Lukens M.D.   On: 11/18/2015 08:08    Echo 10/27/15 >> mild LVH, EF 40 to 45%, grade 2 DD, mod/sever MR, mild TR, PAS 53  mmHg   Description: 70 [facility] 2nd to COPD and CHF.  [facility]  He developed VF arrest with VDRF on 9/20.  He had tracheostomy on 9/26 by Dr. Ezzard StandingNewman.  Assessment/plan:  Acute on chronic hypoxic/hypercapnic respiratory failure. COPD with emphysema. Failure to wean s/p tracheostomy. - pressure support as tolerated >> not making much progress - adjust oxygen to keep SpO2 90 o 95% - continue scheduled BDs - f/u CXR intermittently  Acute on chronic diastolic CHF. Mitral regurgitation. WHO class 2 pulmonary HTN. - cardiology s/o 10/05 - per primary team  D/w Dr. Leanor RubensteinHijazi  Areyanna Figeroa, MD [facility] 11/18/2015, 11:16 AM Pager:  [phone] After 3pm call: [phone]

## 2015-11-21 LAB — BASIC METABOLIC PANEL
ANION GAP: 11 (ref 5–15)
BUN: 32 mg/dL — ABNORMAL HIGH (ref 6–20)
CALCIUM: 8.9 mg/dL (ref 8.9–10.3)
CHLORIDE: 95 mmol/L — AB (ref 101–111)
CO2: 32 mmol/L (ref 22–32)
CREATININE: 1.51 mg/dL — AB (ref 0.61–1.24)
GFR calc non Af Amer: 44 mL/min — ABNORMAL LOW (ref >60)
GFR, EST AFRICAN AMERICAN: 51 mL/min — AB (ref >60)
Glucose, Bld: 122 mg/dL — ABNORMAL HIGH (ref 65–99)
Potassium: 3.8 mmol/L (ref 3.5–5.1)
SODIUM: 138 mmol/L (ref 135–145)

## 2015-11-21 LAB — BRAIN NATRIURETIC PEPTIDE: B NATRIURETIC PEPTIDE 5: 182.3 pg/mL — AB (ref 0.0–100.0)

## 2015-11-23 ENCOUNTER — Other Ambulatory Visit (HOSPITAL_COMMUNITY): Payer: Medicare Other

## 2015-11-23 LAB — BASIC METABOLIC PANEL
ANION GAP: 9 (ref 5–15)
BUN: 30 mg/dL — ABNORMAL HIGH (ref 6–20)
CHLORIDE: 95 mmol/L — AB (ref 101–111)
CO2: 29 mmol/L (ref 22–32)
Calcium: 8.6 mg/dL — ABNORMAL LOW (ref 8.9–10.3)
Creatinine, Ser: 1.36 mg/dL — ABNORMAL HIGH (ref 0.61–1.24)
GFR calc Af Amer: 58 mL/min — ABNORMAL LOW (ref >60)
GFR calc non Af Amer: 50 mL/min — ABNORMAL LOW (ref >60)
GLUCOSE: 136 mg/dL — AB (ref 65–99)
POTASSIUM: 3.7 mmol/L (ref 3.5–5.1)
Sodium: 133 mmol/L — ABNORMAL LOW (ref 135–145)

## 2015-11-23 LAB — CBC
HEMATOCRIT: 26.4 % — AB (ref 39.0–52.0)
Hemoglobin: 8.3 g/dL — ABNORMAL LOW (ref 13.0–17.0)
MCH: 28.2 pg (ref 26.0–34.0)
MCHC: 31.4 g/dL (ref 30.0–36.0)
MCV: 89.8 fL (ref 78.0–100.0)
Platelets: 199 10*3/uL (ref 150–400)
RBC: 2.94 MIL/uL — AB (ref 4.22–5.81)
RDW: 18.5 % — ABNORMAL HIGH (ref 11.5–15.5)
WBC: 9 10*3/uL (ref 4.0–10.5)

## 2016-04-01 DEATH — deceased

## 2017-03-22 IMAGING — CR DG CHEST 1V PORT
1 series · 1 of 1 positions shown · non-contrast
Comparison: 10/31/2015; 10/29/2015; 10/22/2015

CLINICAL DATA: Cough

EXAM:
PORTABLE CHEST 1 VIEW

[AP]
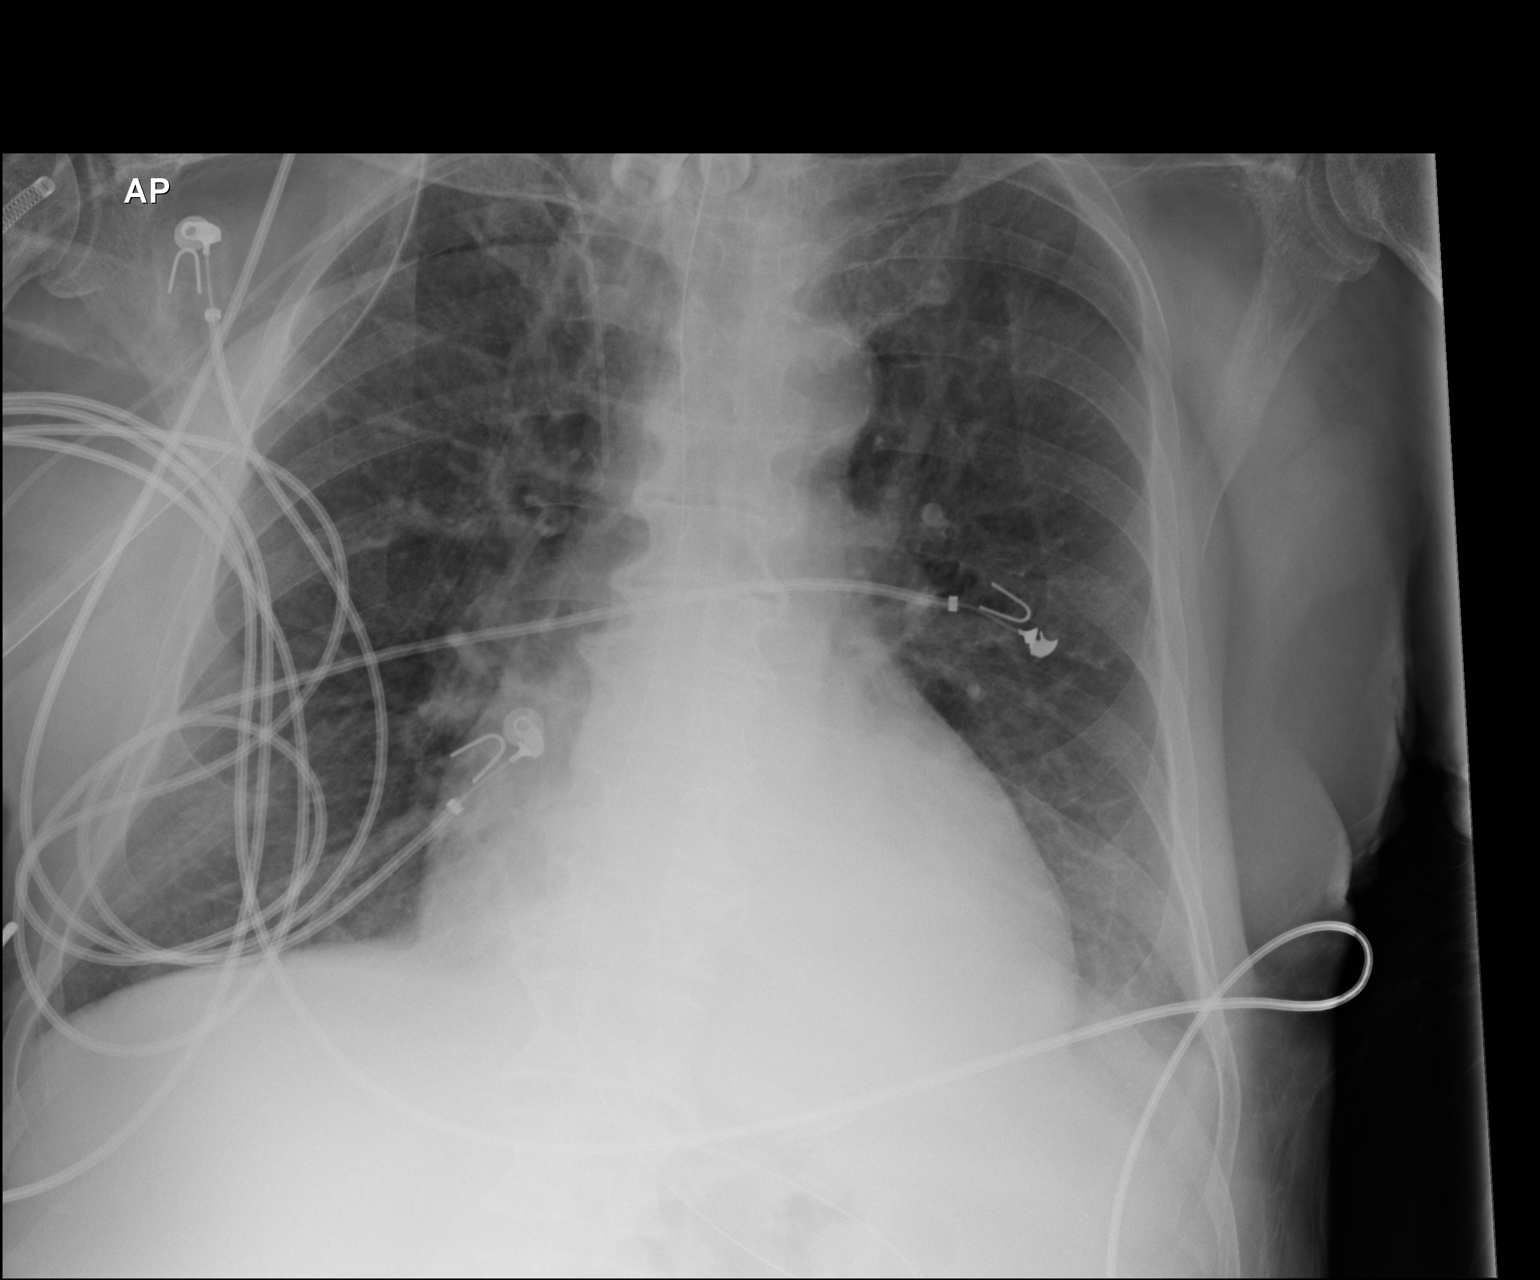

[1 of 1 positions shown; findings below may reference images not displayed]

FINDINGS: Grossly unchanged cardiac silhouette and mediastinal contours.
Atherosclerotic plaque within thoracic aorta. Stable position of
support apparatus. No pneumothorax.

Overall improved aeration of the lungs with increased conspicuity of
the pulmonary vasculature. Residual bilateral infrahilar
heterogeneous / consolidative opacities. No definite pleural
effusion. No acute osseus abnormalities.
IMPRESSION: 1.  Stable positioning of support apparatus.  No pneumothorax.
2. Improved aeration along suggests improving edema and/or
atelectasis. No new focal airspace opacities.
3.  Aortic Atherosclerosis (E91BN-170.0)

## 2017-03-27 IMAGING — DX DG CHEST 1V PORT
1 series · 1 of 1 positions shown · non-contrast
Comparison: 11/02/2015 .

CLINICAL DATA: Respiratory failure.

EXAM:
PORTABLE CHEST 1 VIEW

[chest ap]
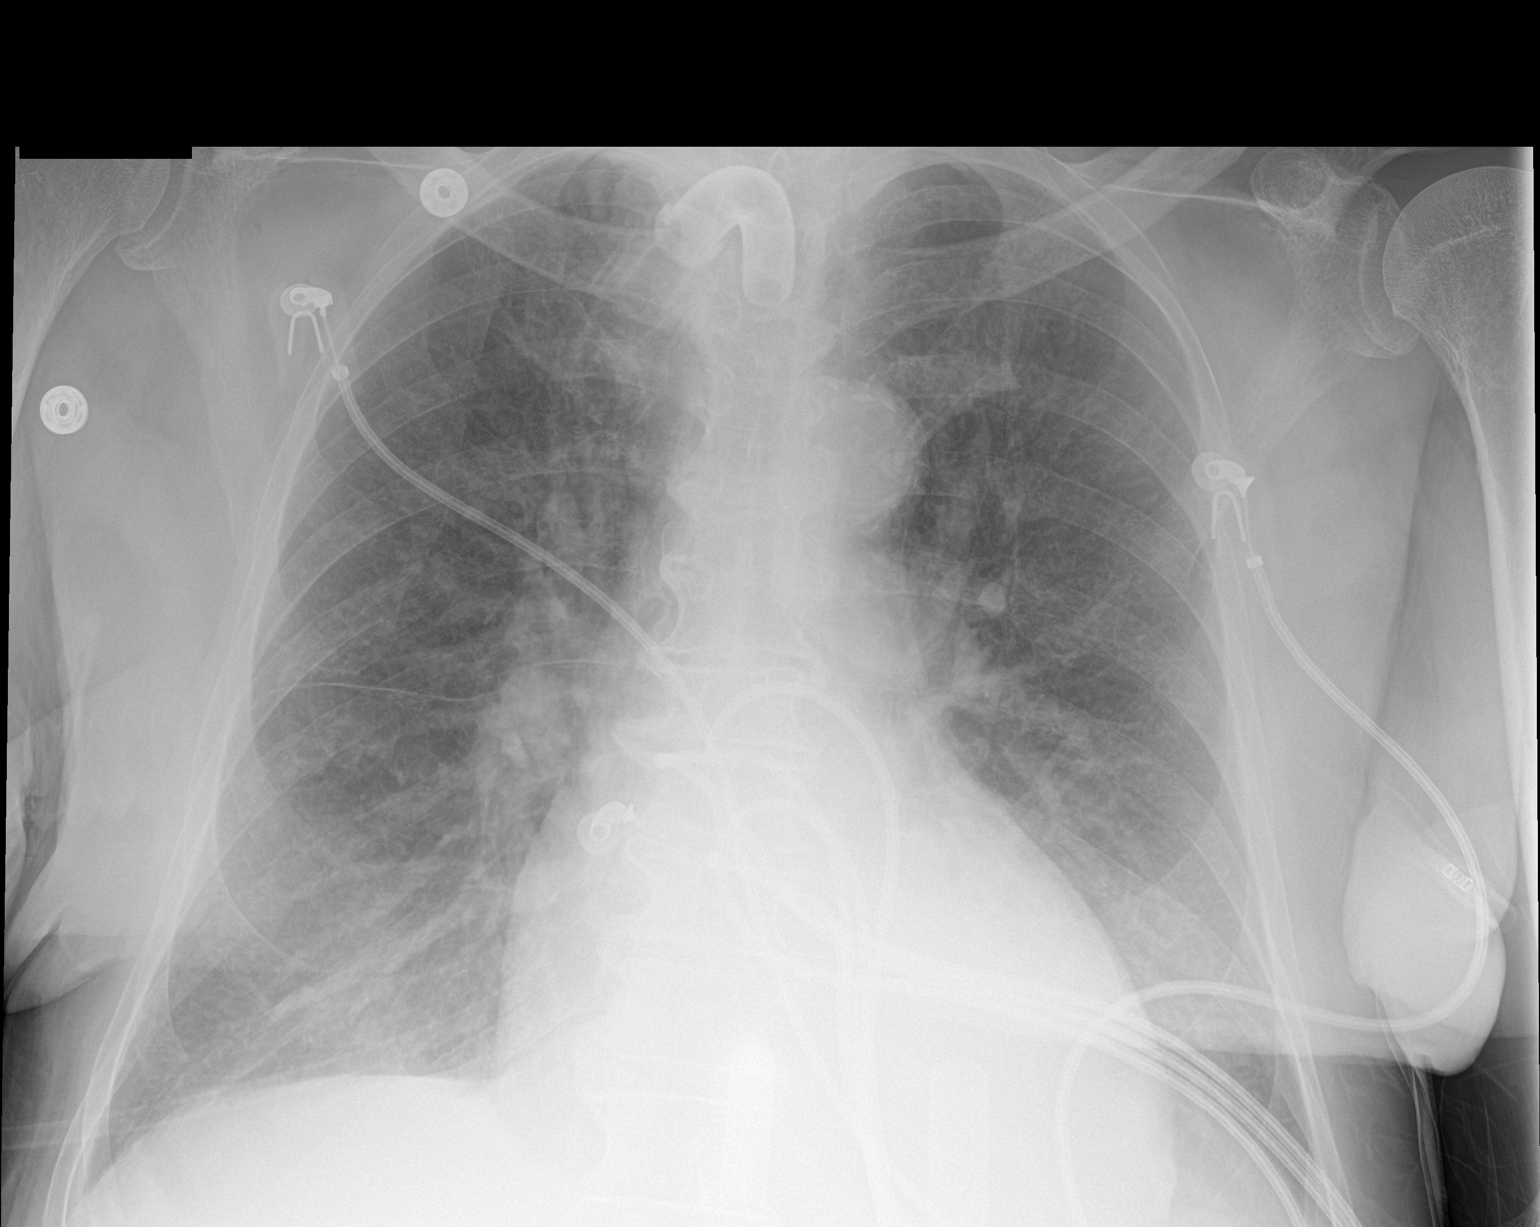

[1 of 1 positions shown; findings below may reference images not displayed]

FINDINGS: Tracheostomy tube in stable position. Cardiomegaly with pulmonary
vascular prominence and bilateral interstitial prominence consistent
mild congestive heart failure. No pleural effusion or pneumothorax.
IMPRESSION: 1. Tracheostomy tube in stable position.
2. Mild congestive heart failure with mild pulmonary interstitial
edema.

## 2017-04-03 IMAGING — DX DG CHEST 1V PORT
1 series · 1 of 1 positions shown · non-contrast
Comparison: November 07, 2015

CLINICAL DATA: Hypoxia

EXAM:
PORTABLE CHEST 1 VIEW

[chest ap]
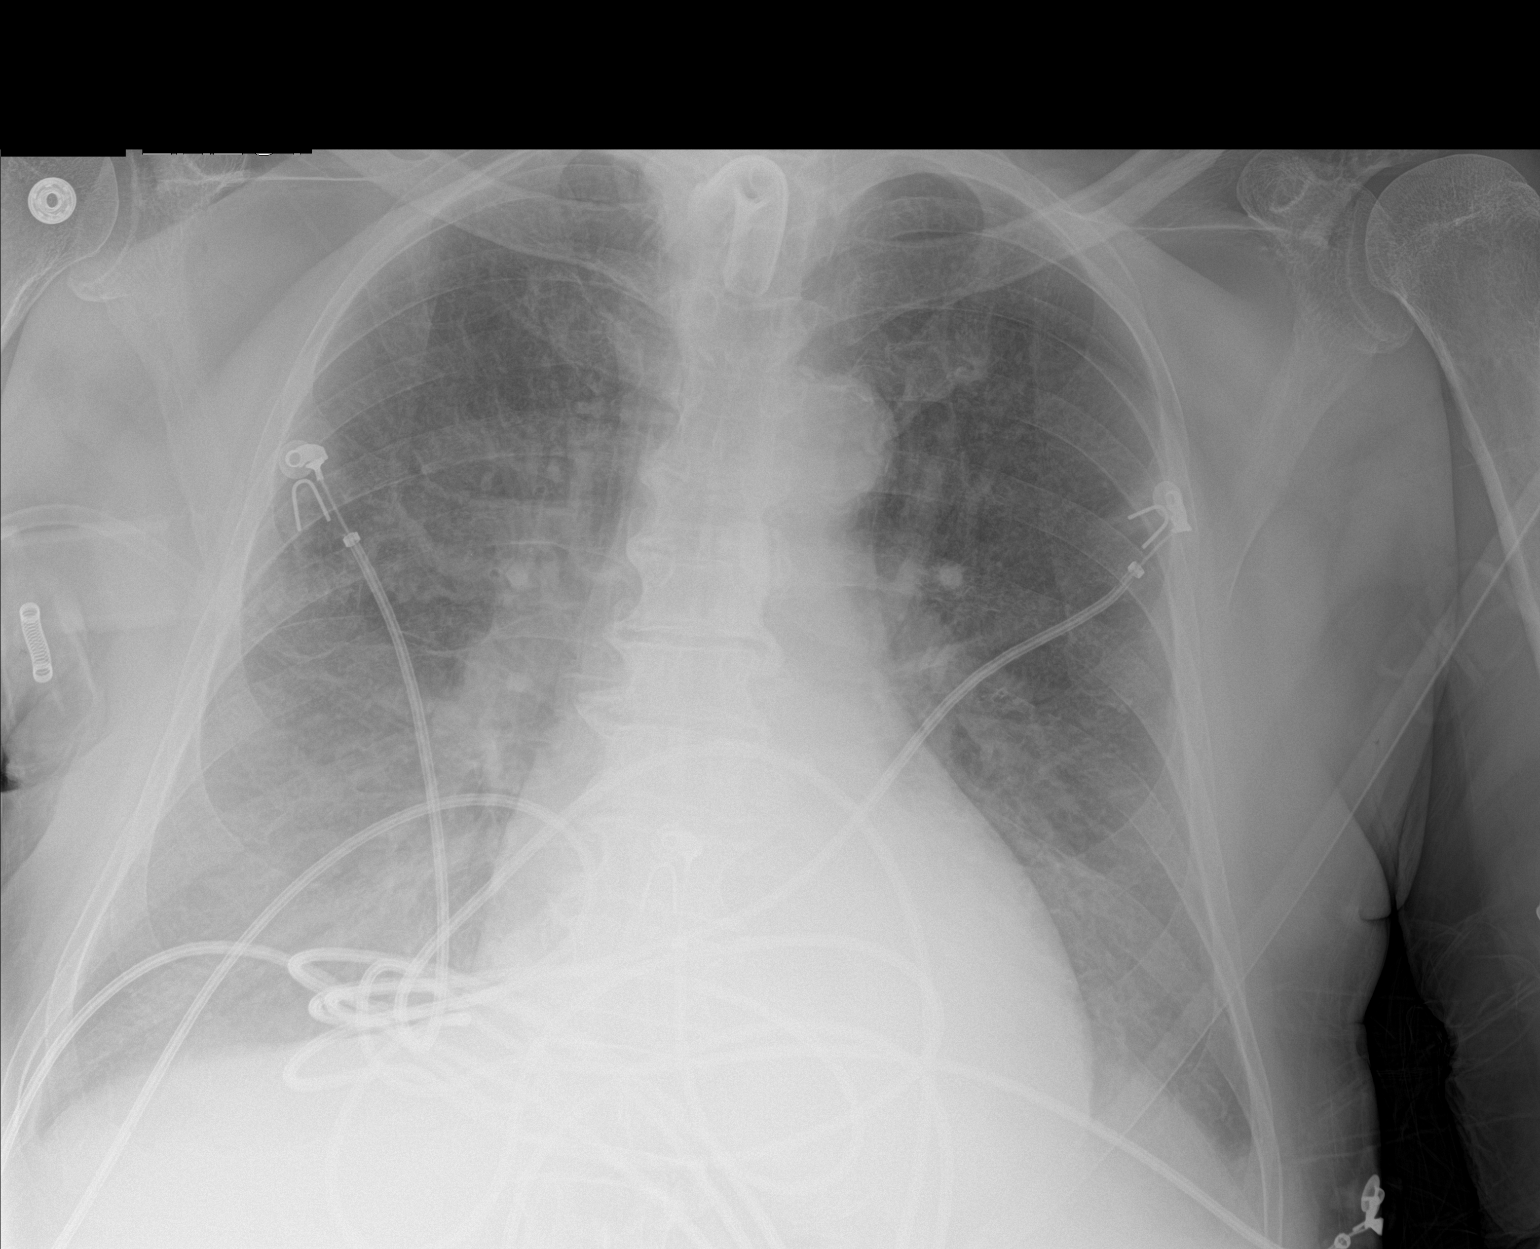

[1 of 1 positions shown; findings below may reference images not displayed]

FINDINGS: Tracheostomy catheter tip is 8.1 cm above the carina. No
pneumothorax. There is persistent mild interstitial edema with small
pleural effusions bilaterally. There is cardiomegaly with pulmonary
venous hypertension. No adenopathy. There is atherosclerotic
calcification in the aorta.
IMPRESSION: Tracheostomy as described without pneumothorax. Evidence of a degree
of congestive heart failure persists without change. No new opacity
evident. There is aortic atherosclerosis.

## 2017-04-07 IMAGING — DX DG CHEST 1V PORT
1 series · 1 of 1 positions shown · non-contrast
Comparison: 11/14/2015

CLINICAL DATA: Respiratory failure

EXAM:
PORTABLE CHEST 1 VIEW

[chest ap]
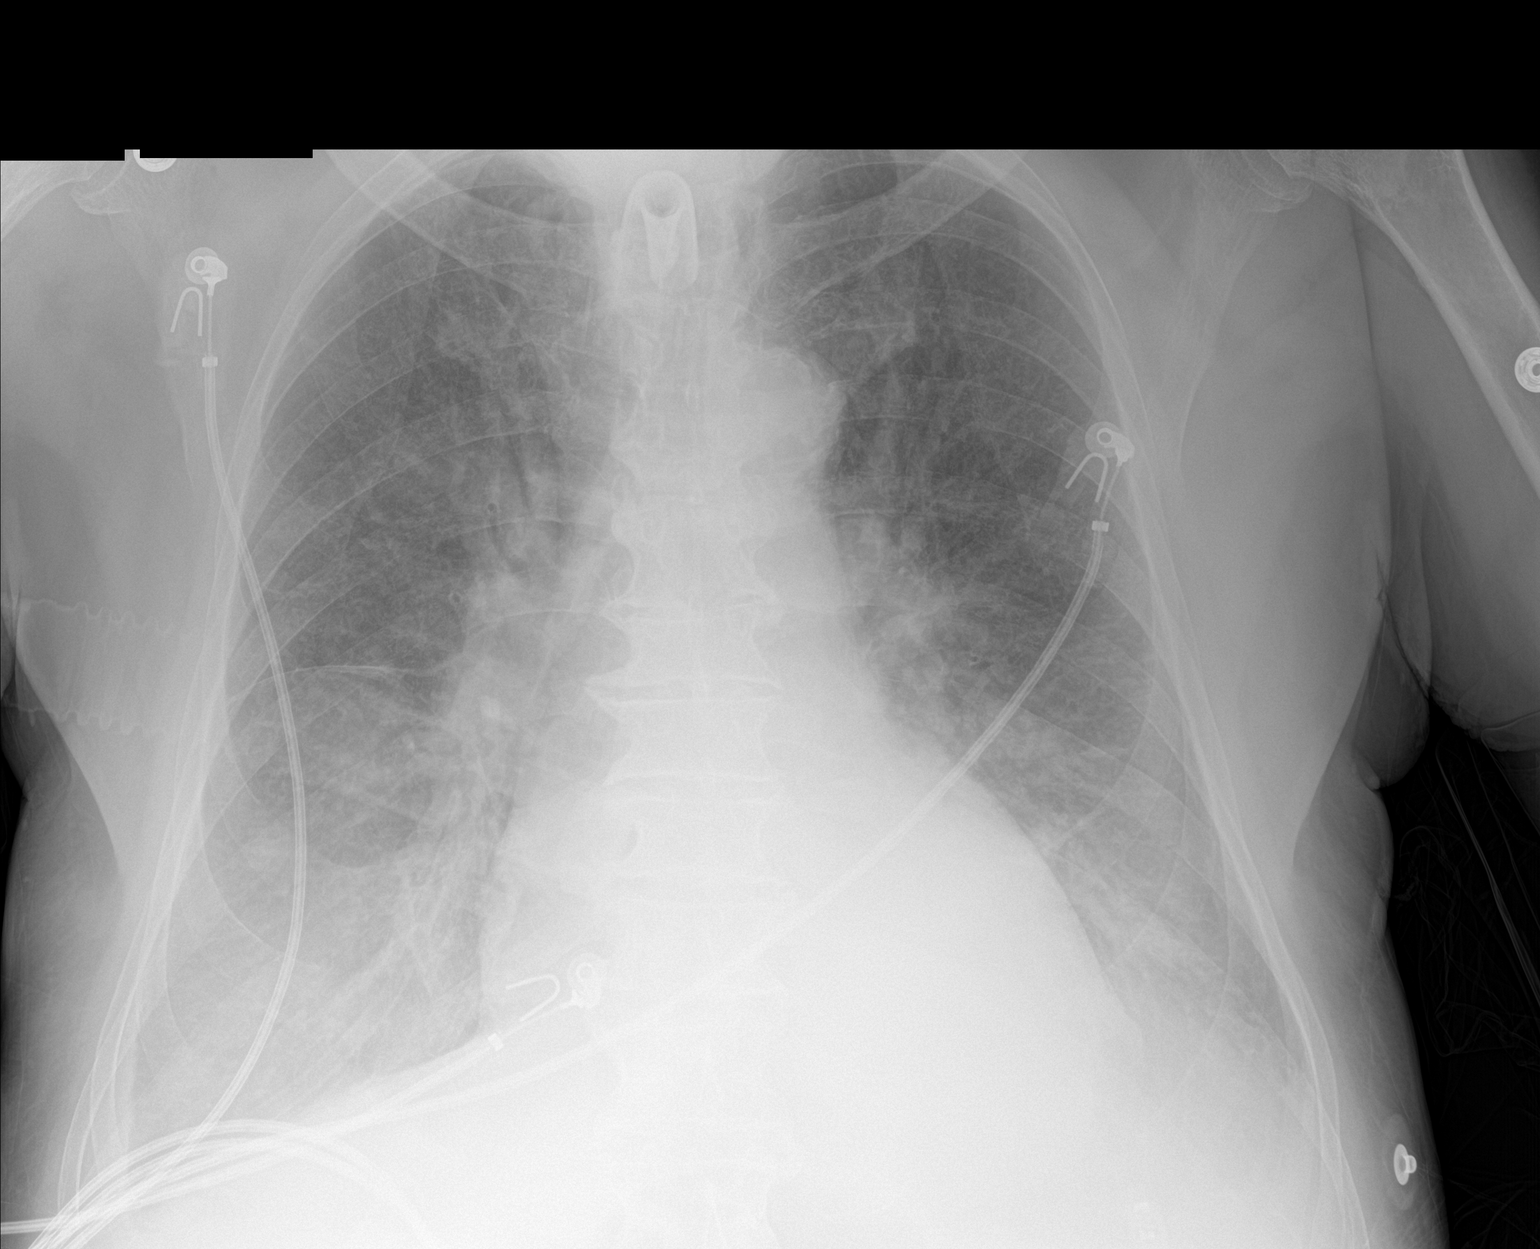

[1 of 1 positions shown; findings below may reference images not displayed]

FINDINGS: Cardiac shadow is stable. Tracheostomy tube is again noted in
satisfactory position. Vascular congestion is again identified. No
significant interstitial edema is noted although the bilateral small
pleural effusions are seen. No new focal infiltrate is noted.
Persistent left retrocardiac density is seen consistent with
atelectasis.
IMPRESSION: Stable CHF and left lower lobe atelectasis.
